# Patient Record
Sex: Female | Born: 2000 | Hispanic: Yes | Marital: Single
Health system: Midwestern US, Community
[De-identification: ages and names within clinical notes are randomized; demographics above are authoritative.]

## PROBLEM LIST (undated history)

## (undated) DIAGNOSIS — Z3A27 27 weeks gestation of pregnancy: Secondary | ICD-10-CM

## (undated) DIAGNOSIS — Z331 Pregnant state, incidental: Secondary | ICD-10-CM

## (undated) DIAGNOSIS — Z3492 Encounter for supervision of normal pregnancy, unspecified, second trimester: Secondary | ICD-10-CM

---
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
## 2021-03-12 ENCOUNTER — Inpatient Hospital Stay
Admit: 2021-03-12 | Discharge: 2021-03-12 | Disposition: A | Discharge status: Home / Self Care | Payer: TRICARE (CHAMPUS) | Attending: Emergency Medicine

## 2021-03-12 DIAGNOSIS — R21 Rash and other nonspecific skin eruption: Secondary | ICD-10-CM

## 2021-03-12 MED ORDER — TRIAMCINOLONE ACETONIDE 0.025 % OINTMENT
0.025 % | Freq: Two times a day (BID) | CUTANEOUS | 0 refills | Status: DC
Start: 2021-03-12 — End: 2021-03-23

## 2021-03-12 NOTE — ED Provider Notes (Signed)
EMERGENCY DEPARTMENT HISTORY AND PHYSICAL EXAM    7:59 AM    Date: 03/12/2021  Patient Name: Jaclyn Jimenez    History of Presenting Illness     Chief Complaint   Patient presents with   ??? Rash       History Provided By: Patient  Location/Duration/Severity/Modifying factors   HPI   Jaclyn Jimenez is a 20 y.o. female with no past medical problems presenting for evaluation of rash on her left leg.  She says that for the last 24 or so hours she has had an eruption rash on her left leg.  It is mildly painful, describes it as a dull ache.  It is red and blistering on the surface.  He denies any exposures to any viruses, strange metals, new medication, change in diet.  No hives rash or anaphylaxis like symptoms.  She applied some anti-itch cream on it but this has not helped with any of the pain or the itching.  Denies history of HSV, shingles, and is vaccinated against varicella.  No similar lesions elsewhere on her body including genitalia or lips.  No alleviating or aggravating factors.  Overall symptoms are mild to moderate    PCP: No primary care provider on file.    Current Outpatient Medications   Medication Sig Dispense Refill   ??? triamcinolone acetonide (KENALOG) 0.025 % ointment Apply  to affected area two (2) times a day. use thin layer 30 g 0       Past History     Past Medical History:  No past medical history on file.    Past Surgical History:  No past surgical history on file.    Family History:  No family history on file.    Social History:  Social History     Tobacco Use   ??? Smoking status: Not on file   ??? Smokeless tobacco: Not on file   Substance Use Topics   ??? Alcohol use: Not on file   ??? Drug use: Not on file       Allergies:  No Known Allergies    I reviewed and confirmed the above information with patient and updated as necessary.    Review of Systems     Review of Systems   Constitutional: Negative for diaphoresis and fever.   HENT: Negative for ear pain and sore throat.    Eyes:         No acute change in vision   Respiratory: Negative for cough and shortness of breath.    Cardiovascular: Negative for chest pain and leg swelling.   Gastrointestinal: Negative for abdominal pain and vomiting.   Genitourinary: Negative for dysuria.   Musculoskeletal: Negative for neck pain.   Skin: Positive for rash. Negative for wound.   Neurological: Negative for weakness and headaches.       Physical Exam     Visit Vitals  BP 107/76   Pulse 90   Temp 98.8 ??F (37.1 ??C)   Resp 18   Wt 50.8 kg (112 lb)   SpO2 100%       Physical Exam  Vitals and nursing note reviewed.   Constitutional:       Comments: Adult female resting comfortably, nondistressed   HENT:      Mouth/Throat:      Mouth: Mucous membranes are moist.   Eyes:      Pupils: Pupils are equal, round, and reactive to light.   Cardiovascular:  Rate and Rhythm: Normal rate and regular rhythm.      Pulses: Normal pulses.   Pulmonary:      Effort: Pulmonary effort is normal.      Breath sounds: Normal breath sounds.   Abdominal:      Palpations: Abdomen is soft.      Tenderness: There is no abdominal tenderness.   Musculoskeletal:         General: No signs of injury. Normal range of motion.      Cervical back: Normal range of motion.   Skin:     General: Skin is warm.      Findings: Rash (3 cm x 3 cm area of erythema with small appearing pustules on the surface.  Minimally tender to palpation) present.   Neurological:      General: No focal deficit present.      Mental Status: She is alert and oriented to person, place, and time. Mental status is at baseline.         Diagnostic Study Results     Labs -  No results found for this or any previous visit (from the past 24 hour(s)).      Radiologic Studies -   No orders to display           Medical Decision Making   I am the first provider for this patient.    I reviewed the vital signs, available nursing notes, past medical history, past surgical history, family history and social history.    Vital  Signs-Reviewed the patient's vital signs.    Records Reviewed: Nursing Notes and Old Medical Records (Time of Review: 7:59 AM)    Provider Notes (Medical Decision Making):   MDM  20 year old female here with rash on her leg, consider ringworm, bacterial, abscess, viral such as HSV or varicella/shingles.    ED Course: Progress Notes, Reevaluation, and Consults:  Patient afebrile hemodynamically normal  Bedside ultrasound without underlying loculations or fluid collection, no evidence of an abscess    Discussed care plan with the patient, will test for viral etiologies with a viral culture, will treat with triamcinolone cream and ibuprofen for now.  Signs and symptoms prompting return to the ED were discussed.  She was discharged home in stable condition           Procedures    Diagnosis     Clinical Impression:   1. Rash/skin eruption      Disposition: Home    Follow-up Information     Follow up With Specialties Details Why Contact Info    [facility] EMERGENCY DEPT Emergency Medicine  As needed, If symptoms worsen [address]  [place] [address]  [phone]           Patient's Medications   Start Taking    TRIAMCINOLONE ACETONIDE (KENALOG) 0.025 % OINTMENT    Apply  to affected area two (2) times a day. use thin layer   Continue Taking    No medications on file   These Medications have changed    No medications on file   Stop Taking    No medications on file       Charline Bills, MD   Emergency Medicine   March 12, 2021, 7:59 AM     This note is dictated utilizing Conservation officer, historic buildings. Unfortunately this leads to occasional typographical errors using the voice recognition. I apologize in advance if the situation occurs. If questions occur please do not hesitate to contact  me directly.    Abner Greenspan, MD

## 2021-03-12 NOTE — ED Notes (Signed)
Patient arrived to ED with rash on left upper thigh x 3 days

## 2021-03-19 LAB — CULTURE, VIRAL

## 2021-03-23 ENCOUNTER — Inpatient Hospital Stay
Admit: 2021-03-23 | Discharge: 2021-03-23 | Disposition: A | Discharge status: Home / Self Care | Payer: TRICARE (CHAMPUS) | Attending: Emergency Medicine

## 2021-03-23 DIAGNOSIS — N3000 Acute cystitis without hematuria: Secondary | ICD-10-CM

## 2021-03-23 LAB — WET PREP
Wet Prep: NONE SEEN
Wet Prep: NONE SEEN
Wet prep: NONE SEEN
Wet prep: NONE SEEN

## 2021-03-23 LAB — CBC WITH AUTOMATED DIFF
ABS. BASOPHILS: 0 10*3/uL (ref 0.0–0.1)
ABS. EOSINOPHILS: 0.1 10*3/uL (ref 0.0–0.4)
ABS. IMM. GRANS.: 0 10*3/uL (ref 0.00–0.04)
ABS. LYMPHOCYTES: 2.2 10*3/uL (ref 0.9–3.6)
ABS. MONOCYTES: 0.5 10*3/uL (ref 0.05–1.2)
ABS. NEUTROPHILS: 4.6 10*3/uL (ref 1.8–8.0)
ABSOLUTE NRBC: 0 10*3/uL (ref 0.00–0.01)
BASOPHILS: 0 % (ref 0–2)
EOSINOPHILS: 1 % (ref 0–5)
HCT: 40.8 % (ref 35.0–45.0)
HGB: 13.5 g/dL (ref 12.0–16.0)
IMMATURE GRANULOCYTES: 0 % (ref 0.0–0.5)
LYMPHOCYTES: 29 % (ref 21–52)
MCH: 30.3 PG (ref 24.0–34.0)
MCHC: 33.1 g/dL (ref 31.0–37.0)
MCV: 91.5 FL (ref 78.0–100.0)
MONOCYTES: 7 % (ref 3–10)
MPV: 11.2 FL (ref 9.2–11.8)
NEUTROPHILS: 62 % (ref 40–73)
NRBC: 0 PER 100 WBC
PLATELET: 352 10*3/uL (ref 135–420)
RBC: 4.46 M/uL (ref 4.20–5.30)
RDW: 13.2 % (ref 11.6–14.5)
WBC: 7.4 10*3/uL (ref 4.6–13.2)

## 2021-03-23 LAB — METABOLIC PANEL, COMPREHENSIVE
A-G Ratio: 1.1 (ref 0.8–1.7)
ALT (SGPT): 18 U/L (ref 13–56)
AST (SGOT): 16 U/L (ref 10–38)
Albumin: 4.2 g/dL (ref 3.4–5.0)
Alk. phosphatase: 106 U/L (ref 45–117)
Anion gap: 1 mmol/L — ABNORMAL LOW (ref 3.0–18)
BUN/Creatinine ratio: 17 (ref 12–20)
BUN: 11 MG/DL (ref 7.0–18)
Bilirubin, total: 0.6 MG/DL (ref 0.2–1.0)
CO2: 31 mmol/L (ref 21–32)
Calcium: 9.4 MG/DL (ref 8.5–10.1)
Chloride: 107 mmol/L (ref 100–111)
Creatinine: 0.65 MG/DL (ref 0.6–1.3)
GFR est AA: >60 mL/min/{1.73_m2} (ref >60)
GFR est non-AA: >60 mL/min/{1.73_m2} (ref >60)
Globulin: 3.8 g/dL (ref 2.0–4.0)
Glucose: 88 mg/dL (ref 74–99)
Potassium: 3.9 mmol/L (ref 3.5–5.5)
Protein, total: 8 g/dL (ref 6.4–8.2)
Sodium: 139 mmol/L (ref 136–145)

## 2021-03-23 LAB — URINALYSIS W/ RFLX MICROSCOPIC
Bilirubin, Urine: NEGATIVE
Bilirubin: NEGATIVE
Glucose, Ur: NEGATIVE mg/dL
Glucose: NEGATIVE mg/dL
Ketone: NEGATIVE mg/dL
Ketones, Urine: NEGATIVE mg/dL
Nitrite, Urine: NEGATIVE
Nitrites: NEGATIVE
Protein, UA: NEGATIVE mg/dL
Protein: NEGATIVE mg/dL
Specific Gravity, UA: 1.01 (ref 1.005–1.030)
Specific gravity: 1.01 (ref 1.005–1.030)
Urobilinogen, UA, POCT: 0.2 EU/dL (ref 0.2–1.0)
Urobilinogen: 0.2 EU/dL (ref 0.2–1.0)
pH (UA): 7.5 (ref 5.0–8.0)
pH, UA: 7.5 (ref 5.0–8.0)

## 2021-03-23 LAB — URINE MICROSCOPIC ONLY

## 2021-03-23 LAB — HCG URINE, QL
HCG urine, QL: NEGATIVE
Pregnancy Test(Urn): NEGATIVE

## 2021-03-23 LAB — LIPASE
Lipase: 123 U/L (ref 73–393)
Lipase: 123 U/L (ref 73–393)

## 2021-03-23 LAB — CBC WITH AUTO DIFFERENTIAL
Basophils %: 0 % (ref 0–2)
Basophils Absolute: 0 10*3/uL (ref 0.0–0.1)
Eosinophils %: 1 % (ref 0–5)
Eosinophils Absolute: 0.1 10*3/uL (ref 0.0–0.4)
Granulocyte Absolute Count: 0 10*3/uL (ref 0.00–0.04)
Hematocrit: 40.8 % (ref 35.0–45.0)
Hemoglobin: 13.5 g/dL (ref 12.0–16.0)
Immature Granulocytes: 0 % (ref 0.0–0.5)
Lymphocytes %: 29 % (ref 21–52)
Lymphocytes Absolute: 2.2 10*3/uL (ref 0.9–3.6)
MCH: 30.3 PG (ref 24.0–34.0)
MCHC: 33.1 g/dL (ref 31.0–37.0)
MCV: 91.5 FL (ref 78.0–100.0)
MPV: 11.2 FL (ref 9.2–11.8)
Monocytes %: 7 % (ref 3–10)
Monocytes Absolute: 0.5 10*3/uL (ref 0.05–1.2)
NRBC Absolute: 0 10*3/uL (ref 0.00–0.01)
Neutrophils %: 62 % (ref 40–73)
Neutrophils Absolute: 4.6 10*3/uL (ref 1.8–8.0)
Nucleated RBCs: 0 PER 100 WBC
Platelets: 352 10*3/uL (ref 135–420)
RBC: 4.46 M/uL (ref 4.20–5.30)
RDW: 13.2 % (ref 11.6–14.5)
WBC: 7.4 10*3/uL (ref 4.6–13.2)

## 2021-03-23 LAB — COMPREHENSIVE METABOLIC PANEL
ALT: 18 U/L (ref 13–56)
AST: 16 U/L (ref 10–38)
Albumin/Globulin Ratio: 1.1 (ref 0.8–1.7)
Albumin: 4.2 g/dL (ref 3.4–5.0)
Alkaline Phosphatase: 106 U/L (ref 45–117)
Anion Gap: 1 mmol/L — ABNORMAL LOW (ref 3.0–18)
BUN: 11 MG/DL (ref 7.0–18)
Bun/Cre Ratio: 17 (ref 12–20)
CO2: 31 mmol/L (ref 21–32)
Calcium: 9.4 MG/DL (ref 8.5–10.1)
Chloride: 107 mmol/L (ref 100–111)
Creatinine: 0.65 MG/DL (ref 0.6–1.3)
EGFR IF NonAfrican American: >60 mL/min/{1.73_m2} (ref >60)
GFR African American: >60 mL/min/{1.73_m2} (ref >60)
Globulin: 3.8 g/dL (ref 2.0–4.0)
Glucose: 88 mg/dL (ref 74–99)
Potassium: 3.9 mmol/L (ref 3.5–5.5)
Sodium: 139 mmol/L (ref 136–145)
Total Bilirubin: 0.6 MG/DL (ref 0.2–1.0)
Total Protein: 8 g/dL (ref 6.4–8.2)

## 2021-03-23 MED ORDER — CEPHALEXIN 500 MG CAP
500 mg | ORAL_CAPSULE | Freq: Two times a day (BID) | ORAL | 0 refills | Status: AC
Start: 2021-03-23 — End: 2021-03-30

## 2021-03-23 NOTE — ED Provider Notes (Signed)
EMERGENCY DEPARTMENT HISTORY AND PHYSICAL EXAM    Date: 03/23/2021  Patient Name: Jaclyn Jimenez    History of Presenting Illness     Chief Complaint   Patient presents with   ??? Vaginal Bleeding       History Provided By: Patient     History Jaclyn Jimenez):   1:37 PM  Jaclyn Jimenez is a 20 y.o. female with no contributory PMHX who presents to the emergency department C/O vaginal bleeding onset 2 weeks ago. Associated sxs include dysuria. Pt denies any other sxs or complaints.  Patient's been having 2 weeks of dysuria and vaginal bleeding.  She is having a lot of pelvic discomfort.  She is requesting STD check as well.    Chief Complaint: Vaginal bleeding  Onset: 2 weeks ago  Timing:  Acute  Context: Symptoms started spontaneously, symptoms have progressively worsened since onset  Location: Vagina  Quality: Sharp  Severity: Moderate  Modifying Factors: Nothing makes it better, urinating makes it worse.  Associated Symptoms: Dysuria, pelvic discomfort    PCP: Other, Phys, MD     Past History         Past Medical History:  History reviewed. No pertinent past medical history.    Past Surgical History:  History reviewed. No pertinent surgical history.    Family History:  History reviewed. No pertinent family history.  Reviewed and non-contributory    Social History:  Social History     Tobacco Use   ??? Smoking status: Never Smoker   ??? Smokeless tobacco: Never Used   Substance Use Topics   ??? Alcohol use: Not Currently   ??? Drug use: Not Currently       Medications:      Allergies:  No Known Allergies    Review of Systems      Review of Systems   Constitutional: Negative for chills and fever.   HENT: Negative for congestion, rhinorrhea and sore throat.    Eyes: Negative for pain and visual disturbance.   Respiratory: Negative for cough, shortness of breath and wheezing.    Cardiovascular: Negative for chest pain and palpitations.   Gastrointestinal: Positive for abdominal pain. Negative for diarrhea and  vomiting.   Genitourinary: Positive for dysuria, pelvic pain, vaginal bleeding and vaginal discharge. Negative for flank pain, frequency and urgency.   Musculoskeletal: Negative for arthralgias and myalgias.   Skin: Negative for rash and wound.   Neurological: Negative for speech difficulty, weakness, light-headedness and headaches.   Psychiatric/Behavioral: Negative for agitation and confusion.   All other systems reviewed and are negative.      Physical Exam     Vitals:    03/23/21 1209   BP: 115/69   Pulse: 78   Resp: 17   Temp: 98 ??F (36.7 ??C)   SpO2: 100%   Weight: 50.8 kg (112 lb)   Height: 4' 11"  (1.499 m)       Physical Exam  Vitals and nursing note reviewed.   Constitutional:       General: She is not in acute distress.     Appearance: Normal appearance. She is normal weight. She is not ill-appearing.   HENT:      Head: Normocephalic and atraumatic.      Nose: Nose normal. No rhinorrhea.      Mouth/Throat:      Mouth: Mucous membranes are moist.      Pharynx: No oropharyngeal exudate or posterior oropharyngeal erythema.   Eyes:  Extraocular Movements: Extraocular movements intact.      Conjunctiva/sclera: Conjunctivae normal.      Pupils: Pupils are equal, round, and reactive to light.   Cardiovascular:      Rate and Rhythm: Normal rate and regular rhythm.      Heart sounds: No murmur heard.  No friction rub. No gallop.    Pulmonary:      Effort: Pulmonary effort is normal. No respiratory distress.      Breath sounds: Normal breath sounds. No wheezing, rhonchi or rales.   Abdominal:      General: Bowel sounds are normal.      Palpations: Abdomen is soft.      Tenderness: There is abdominal tenderness in the suprapubic area. There is no guarding or rebound. Negative signs include Murphy's sign, Rovsing's sign and McBurney's sign.   Musculoskeletal:         General: No swelling, tenderness or deformity. Normal range of motion.      Cervical back: Normal range of motion and neck supple. No rigidity.    Lymphadenopathy:      Cervical: No cervical adenopathy.   Skin:     General: Skin is warm and dry.      Findings: No rash.   Neurological:      General: No focal deficit present.      Mental Status: She is alert and oriented to person, place, and time.   Psychiatric:         Mood and Affect: Mood normal.         Behavior: Behavior normal.         Diagnostic Study Results     Labs -  Recent Results (from the past 12 hour(s))   CBC WITH AUTOMATED DIFF    Collection Time: 03/23/21  1:20 PM   Result Value Ref Range    WBC 7.4 4.6 - 13.2 K/uL    RBC 4.46 4.20 - 5.30 M/uL    HGB 13.5 12.0 - 16.0 g/dL    HCT 40.8 35.0 - 45.0 %    MCV 91.5 78.0 - 100.0 FL    MCH 30.3 24.0 - 34.0 PG    MCHC 33.1 31.0 - 37.0 g/dL    RDW 13.2 11.6 - 14.5 %    PLATELET 352 135 - 420 K/uL    MPV 11.2 9.2 - 11.8 FL    NRBC 0.0 0 PER 100 WBC    ABSOLUTE NRBC 0.00 0.00 - 0.01 K/uL    NEUTROPHILS 62 40 - 73 %    LYMPHOCYTES 29 21 - 52 %    MONOCYTES 7 3 - 10 %    EOSINOPHILS 1 0 - 5 %    BASOPHILS 0 0 - 2 %    IMMATURE GRANULOCYTES 0 0.0 - 0.5 %    ABS. NEUTROPHILS 4.6 1.8 - 8.0 K/UL    ABS. LYMPHOCYTES 2.2 0.9 - 3.6 K/UL    ABS. MONOCYTES 0.5 0.05 - 1.2 K/UL    ABS. EOSINOPHILS 0.1 0.0 - 0.4 K/UL    ABS. BASOPHILS 0.0 0.0 - 0.1 K/UL    ABS. IMM. GRANS. 0.0 0.00 - 0.04 K/UL    DF AUTOMATED     METABOLIC PANEL, COMPREHENSIVE    Collection Time: 03/23/21  1:20 PM   Result Value Ref Range    Sodium 139 136 - 145 mmol/L    Potassium 3.9 3.5 - 5.5 mmol/L    Chloride 107 100 - 111 mmol/L    CO2  31 21 - 32 mmol/L    Anion gap 1 (L) 3.0 - 18 mmol/L    Glucose 88 74 - 99 mg/dL    BUN 11 7.0 - 18 MG/DL    Creatinine 0.65 0.6 - 1.3 MG/DL    BUN/Creatinine ratio 17 12 - 20      GFR est AA >60 >60 ml/min/1.15m    GFR est non-AA >60 >60 ml/min/1.760m   Calcium 9.4 8.5 - 10.1 MG/DL    Bilirubin, total 0.6 0.2 - 1.0 MG/DL    ALT (SGPT) 18 13 - 56 U/L    AST (SGOT) 16 10 - 38 U/L    Alk. phosphatase 106 45 - 117 U/L    Protein, total 8.0 6.4 - 8.2 g/dL     Albumin 4.2 3.4 - 5.0 g/dL    Globulin 3.8 2.0 - 4.0 g/dL    A-G Ratio 1.1 0.8 - 1.7     LIPASE    Collection Time: 03/23/21  1:20 PM   Result Value Ref Range    Lipase 123 73 - 393 U/L   URINALYSIS W/ RFLX MICROSCOPIC    Collection Time: 03/23/21  1:39 PM   Result Value Ref Range    Color YELLOW      Appearance CLOUDY      Specific gravity 1.010 1.005 - 1.030      pH (UA) 7.5 5.0 - 8.0      Protein Negative NEG mg/dL    Glucose Negative NEG mg/dL    Ketone Negative NEG mg/dL    Bilirubin Negative NEG      Blood LARGE (A) NEG      Urobilinogen 0.2 0.2 - 1.0 EU/dL    Nitrites Negative NEG      Leukocyte Esterase LARGE (A) NEG     URINE MICROSCOPIC ONLY    Collection Time: 03/23/21  1:39 PM   Result Value Ref Range    WBC TOO NUMEROUS TO COUNT 0 - 5 /hpf    RBC TOO NUMEROUS TO COUNT 0 - 5 /hpf    Epithelial cells FEW 0 - 5 /lpf    Bacteria FEW (A) NEG /hpf   HCG URINE, QL    Collection Time: 03/23/21  1:40 PM   Result Value Ref Range    HCG urine, QL Negative NEG     WET PREP    Collection Time: 03/23/21  2:05 PM    Specimen: Vagina   Result Value Ref Range    Special Requests: NO SPECIAL REQUESTS      Wet prep FEW  CLUE CELLS PRESENT        Wet prep NO YEAST SEEN      Wet prep NO TRICHOMONAS SEEN         Radiologic Studies -   No orders to display     CT Results  (Last 48 hours)    None        CXR Results  (Last 48 hours)    None          Medications given in the ED-  Medications - No data to display    Procedures     Pelvic Exam    Date/Time: 03/23/2021 2:11 PM  Procedure duration:  10 minutes.  Exam assisted by:  AmEstill BambergRN.  Type of exam performed: speculum.    External genitalia appearance: normal.    Vaginal exam:  bleeding.    Bleeding: mild  Cervical exam:  normal.  Specimen(s) collected:  chlamydia, GC and vaginal culture.  Patient tolerance: patient tolerated the procedure well with no immediate complications          ED Course     I am the first provider for this patient.  I reviewed the vital signs,  available nursing notes, past medical history, past surgical history, family history and social history.    Records Reviewed: Nursing Notes    Cardiac Monitor:  Rate: 78 bpm  Rhythm: sinus rhythm    Pulse Oximetry Analysis - 100% on RA    1:37 PM Initial assessment performed. The patients presenting problems have been discussed, and they are in agreement with the care plan formulated and outlined with them.  I have encouraged them to ask questions as they arise throughout their visit.           Medical Decision Making     Provider Notes (Medical Decision Making):   DDX: UTI, BV, pyelonephritis, STI    Discussion:  20 y.o. female with dysuria, vaginal bleeding and vaginal discharge.  Patient is concerned for STI.  Patient does have signs of UTI today.  No BV or yeast infection.  Patient has pending chlamydia and gonorrhea swabs which will be called to her if she is positive for other.  Patient may follow-up with her primary care doctor or 1 of ours.  Patient understands and agrees with this plan.      Diagnosis and Disposition     DISCHARGE NOTE:  3:44 PM   [place] Vivar Martinez's  results have been reviewed with her.  She has been counseled regarding her diagnosis, treatment, and plan.  She verbally conveys understanding and agreement of the signs, symptoms, diagnosis, treatment and prognosis and additionally agrees to follow up as discussed.  She also agrees with the care-plan and conveys that all of her questions have been answered.  I have also provided discharge instructions for her that include: educational information regarding their diagnosis and treatment, and list of reasons why they would want to return to the ED prior to their follow-up appointment, should her condition change. She has been provided with education for proper emergency department utilization.     CLINICAL IMPRESSION:    1. Acute cystitis without hematuria    2. Vaginal bleeding        PLAN:  1. D/C Home  2.   Current Discharge Medication  List      START taking these medications    Details   cephALEXin (Keflex) 500 mg capsule Take 1 Capsule by mouth two (2) times a day for 7 days.  Qty: 14 Capsule, Refills: 0  Start date: 03/23/2021, End date: 03/30/2021           3.   Follow-up Information     Follow up With Specialties Details [facility]  Schedule an appointment as soon as possible for a visit  As soon as possible, For follow up from Emergency Department visit. [place]  [phone]    [facility]  Schedule an appointment as soon as possible for a visit  As soon as possible, For follow up from Emergency Department visit. [place]  [phone]    Chase Picket, MD Obstetrics & Gynecology Schedule an appointment as soon as possible for a visit  As soon as possible, For follow up from Emergency  Department visit. [place] [address]  [phone]      [facility] EMERGENCY DEPT Emergency Medicine  As needed; If symptoms worsen [address]  [place] [address]  [place]     Please note that this dictation was completed with Dragon, the computer voice recognition software.  Quite often unanticipated grammatical, syntax, homophones, and other interpretive errors are inadvertently transcribed by the computer software.  Please disregard these errors.  Please excuse any errors that have escaped final proofreading.    Jeanella Craze, MD

## 2021-03-23 NOTE — ED Notes (Signed)
 Pt states  I always feel like I have to pee and I have been bleeding for about 2 weeks and it hurts when I pee. I am just having a lot of discomfort down there. I would like to be checked for an STD

## 2021-03-24 LAB — CHLAMYDIA/NEISSERIA AMPLIFICATION
Chlamydia amplification: NEGATIVE
N. gonorrhoeae amplification: NEGATIVE

## 2021-03-24 LAB — C.TRACHOMATIS N.GONORRHOEAE DNA
Chlamydia trachomatis, NAA: NEGATIVE
Neisseria Gonorrhoeae, NAA: NEGATIVE

## 2021-06-18 ENCOUNTER — Inpatient Hospital Stay
Admit: 2021-06-18 | Discharge: 2021-06-18 | Disposition: A | Discharge status: Home / Self Care | Payer: TRICARE (CHAMPUS) | Attending: Emergency Medicine

## 2021-06-18 DIAGNOSIS — N6452 Nipple discharge: Secondary | ICD-10-CM

## 2021-06-18 NOTE — ED Notes (Signed)
Patient reports breast milk leaking 2 weeks post abortion of 9 week pregnancy

## 2021-06-18 NOTE — ED Provider Notes (Signed)
ED Provider Notes by Veronia Beets, PA at 06/18/21 1012                Author: Veronia Beets, PA  Service: Emergency Medicine  Author Type: Physician Assistant       Filed: 06/19/21 1222  Date of Service: 06/18/21 1012  Status: Attested           Editor: Veronia Beets, PA (Physician Assistant)  Cosigner: Buck Mam, MD at 06/19/21 1754          Attestation signed by Buck Mam, MD at 06/19/21 1754          I was personally available for consultation in the emergency department.                                   EMERGENCY DEPARTMENT HISTORY AND PHYSICAL EXAM      Date: 06/18/2021   Patient Name: Jaclyn Jimenez        History of Presenting Illness          Chief Complaint       Patient presents with        ?  Breast Discharge              History Provided By: Patient      10:12 AM   Jaclyn Jimenez is a 20 y.o. female who presents to the emergency department C/O whitish thin discharge from both nipples for the last couple days.  Patient states she had an uncomplicated medical abortion (at [redacted]wks pregnant) almost 2 weeks ago.   She denies any related complaints such as persistent or heavier than expected vaginal bleeding or pain.  She called the abortion clinic and they told her to go see [place].  Pt  denies fever, breast pain, bloody discharge, breast masses, and any other sxs or complaints.       PCP: Other, Phys, MD              Past History        Past Medical History:   History reviewed. No pertinent past medical history.      Past Surgical History:   History reviewed. No pertinent surgical history.      Family History:   History reviewed. No pertinent family history.      Social History:     Social History          Tobacco Use         ?  Smoking status:  Never     ?  Smokeless tobacco:  Never       Substance Use Topics         ?  Alcohol use:  Not Currently         ?  Drug use:  Not Currently            Allergies:   No Known Allergies           Review of Systems     Review of Systems    Constitutional:  Negative for fever.    Skin: Negative.     All other systems reviewed and are negative.           Physical Exam          Vitals:  06/18/21 0920        BP:  103/66     Pulse:  73     Resp:  18     Temp:  98.8 ??F (37.1 ??C)     SpO2:  100%        Weight:  50.8 kg (112 lb)        Physical Exam   Vital signs and nursing notes reviewed.      CONSTITUTIONAL: Alert. Well-appearing; well-nourished; in no apparent distress.   HEAD: Normocephalic; atraumatic.Marland Kitchen   NECK: Supple; FROM without difficulty, non-tender; no cervical lymphadenopathy.       BREAST: Breast exam chaperoned by RN.  No abnormal skin lesions, redness, swelling or masses palpated.  No nipple discharge noted during exam.          SKIN: Normal for age and race; warm; dry; good turgor; no apparent lesions or exudate.   NEURO: A & O x3.    PSYCH:  Mood and affect appropriate.                  Diagnostic Study Results        Labs -    No results found for this or any previous visit (from the past 12 hour(s)).      Radiologic Studies -      No orders to display          CT Results  (Last 48 hours)             None                    CXR Results  (Last 48 hours)             None                     Medications given in the ED-   Medications - No data to display           Medical Decision Making     I am the first provider for this patient.      I reviewed the vital signs, available nursing notes, past medical history, past surgical history, family history and social history.      Vital Signs-Reviewed the patient's vital signs.      Records Reviewed: Nursing Notes         Procedures:   Procedures      ED Course:   10:12 AM    Initial assessment performed. The patients presenting problems have been discussed, and they are in agreement with the care plan formulated and outlined with them.  I have encouraged them to ask questions as they arise throughout  their visit.                Provider Notes (Medical Decision Making): Jaclyn Jimenez is a 20 y.o. female presents  with thin white discharge c/w lactation from both nipples several days after having a medical abortion.  There are no signs of infection, mastitis, masses or other complications, no discharge seen or expressed on exam.  Should resolve on its own the patient  to follow-up with PCP to ensure resolution and no need for further breast work-up for other causes of nipple discharge.        Diagnosis and Disposition           DISCHARGE NOTE:      Jaclyn Jimenez's  results have been reviewed with her.  She  has been counseled regarding her diagnosis, treatment, and plan.  She verbally conveys understanding and agreement of the signs, symptoms, diagnosis, treatment and prognosis and additionally  agrees to follow up as discussed.  She also agrees with the care-plan and conveys that all of her questions have been answered.  I have also provided discharge instructions for her that include: educational information regarding their diagnosis and treatment,  and list of reasons why they would want to return to the ED prior to their follow-up appointment, should her condition change. She has been provided with education for proper emergency department utilization.       CLINICAL IMPRESSION:         1.  Discharge from both nipples            PLAN:   1. D/C Home   2. There are no discharge medications for this patient.      3.      Follow-up Information                  Follow up With  Specialties  Details  Why  Contact Info              [facility]    Schedule an appointment as soon as possible for a visit     [address]   [place] [address]   [phone]              [facility] EMERGENCY DEPT  Emergency Medicine    As needed, If symptoms worsen  [address]   [place] [address]   [phone]                  _______________________________         Please note that this  dictation was completed with Dragon, the computer voice recognition software.  Quite often unanticipated grammatical, syntax, homophones,  and other interpretive errors are inadvertently transcribed by the computer software.  Please disregard these errors.  Please excuse any errors that have escaped final proofreading.

## 2021-08-11 ENCOUNTER — Inpatient Hospital Stay
Admit: 2021-08-11 | Discharge: 2021-08-11 | Disposition: A | Discharge status: Home / Self Care | Attending: Emergency Medicine

## 2021-08-11 DIAGNOSIS — N949 Unspecified condition associated with female genital organs and menstrual cycle: Secondary | ICD-10-CM

## 2021-08-11 LAB — URINALYSIS W/ RFLX MICROSCOPIC
Bilirubin, Urine: NEGATIVE
Bilirubin: NEGATIVE
Glucose, Ur: NEGATIVE mg/dL
Glucose: NEGATIVE mg/dL
Ketone: NEGATIVE mg/dL
Ketones, Urine: NEGATIVE mg/dL
Nitrite, Urine: NEGATIVE
Nitrites: NEGATIVE
Protein, UA: 30 mg/dL — AB
Protein: 30 mg/dL — AB
Specific Gravity, UA: >1.03 NA — ABNORMAL HIGH (ref 1.003–1.030)
Specific gravity: >1.03 — ABNORMAL HIGH (ref 1.003–1.030)
Urobilinogen, UA, POCT: 1 EU/dL (ref 0.2–1.0)
Urobilinogen: 1 EU/dL (ref 0.2–1.0)
pH (UA): 6 (ref 5.0–8.0)
pH, UA: 6 (ref 5.0–8.0)

## 2021-08-11 LAB — CHLAMYDIA/NEISSERIA AMPLIFICATION
Chlamydia amplification: NEGATIVE
N. gonorrhoeae amplification: NEGATIVE

## 2021-08-11 LAB — URINE MICROSCOPIC ONLY
RBC, UA: 4 /hpf (ref 0–5)
RBC: 4 /hpf (ref 0–5)
WBC, UA: 11 /hpf (ref 0–5)
WBC: 11 /hpf (ref 0–5)

## 2021-08-11 LAB — WET PREP

## 2021-08-11 LAB — HCG URINE, QL
HCG urine, QL: NEGATIVE
Pregnancy Test(Urn): NEGATIVE

## 2021-08-11 LAB — C.TRACHOMATIS N.GONORRHOEAE DNA
Chlamydia trachomatis, NAA: NEGATIVE
Neisseria Gonorrhoeae, NAA: NEGATIVE

## 2021-08-11 MED ORDER — TRIMETHOPRIM-SULFAMETHOXAZOLE 160 MG-800 MG TAB
160-800 mg | ORAL_TABLET | Freq: Two times a day (BID) | ORAL | 0 refills | Status: AC
Start: 2021-08-11 — End: 2021-08-16

## 2021-08-11 MED ORDER — FLUCONAZOLE 100 MG TAB
100 mg | ORAL | Status: AC
Start: 2021-08-11 — End: 2021-08-11
  Administered 2021-08-11: 11:00:00 via ORAL

## 2021-08-11 MED ORDER — TRIMETHOPRIM-SULFAMETHOXAZOLE 160 MG-800 MG TAB
160-800 mg | ORAL_TABLET | Freq: Two times a day (BID) | ORAL | 0 refills | Status: DC
Start: 2021-08-11 — End: 2021-08-11

## 2021-08-11 MED ORDER — CLOBETASOL 0.05 % TOPICAL CREAM
0.05 % | Freq: Two times a day (BID) | CUTANEOUS | 0 refills | Status: AC
Start: 2021-08-11 — End: 2021-09-02

## 2021-08-11 MED ORDER — CLOBETASOL 0.05 % TOPICAL CREAM
0.05 % | Freq: Two times a day (BID) | CUTANEOUS | 0 refills | Status: DC
Start: 2021-08-11 — End: 2021-08-11

## 2021-08-11 MED FILL — FLUCONAZOLE 100 MG TAB: 100 mg | ORAL | Qty: 2

## 2021-08-11 NOTE — ED Notes (Signed)
C/O vaginal itching/burning/discharge x 4 days; Endorses having new sexual partner recently but no other new products endorsed.    No fever/CP/SOB/N/V/D.

## 2021-08-11 NOTE — ED Notes (Signed)
Report given to JZ, RN. All questions answered.

## 2021-08-11 NOTE — ED Provider Notes (Signed)
20 year old female without medical history presents to the emergency department with vaginal discomfort.  She says she has "small cuts"  She does not know how that happened.  Also has yellow vaginal discharge not sure if she is pregnant concerned about possible STI.  No history of similar episode.  Now she has vaginal burning with urination.  No blood in urine.  However she feels she is starting her period.  Patient has no fevers or chills no chest pain or shortness of breath no abdominal pain.  No other issues expressed.  Soft ibuprofen last night with some relief.       History reviewed. No pertinent past medical history.    History reviewed. No pertinent surgical history.      History reviewed. No pertinent family history.    Social History     Socioeconomic History   ??? Marital status: SINGLE     Spouse name: Not on file   ??? Number of children: Not on file   ??? Years of education: Not on file   ??? Highest education level: Not on file   Occupational History   ??? Not on file   Tobacco Use   ??? Smoking status: Never   ??? Smokeless tobacco: Never   Substance and Sexual Activity   ??? Alcohol use: Not Currently   ??? Drug use: Not Currently   ??? Sexual activity: Not on file   Other Topics Concern   ??? Not on file   Social History Narrative   ??? Not on file     Social Determinants of Health     Financial Resource Strain: Not on file   Food Insecurity: Not on file   Transportation Needs: Not on file   Physical Activity: Not on file   Stress: Not on file   Social Connections: Not on file   Intimate Partner Violence: Not on file   Housing Stability: Not on file         ALLERGIES: Patient has no known allergies.    Review of Systems   Constitutional: Negative.    HENT: Negative.     Respiratory: Negative.     Genitourinary:  Positive for dysuria and vaginal pain. Negative for decreased urine volume, enuresis, flank pain, frequency, genital sores, hematuria, menstrual problem, pelvic pain, urgency, vaginal bleeding and vaginal  discharge.   Neurological: Negative.    Hematological: Negative.    Psychiatric/Behavioral: Negative.     All other systems reviewed and are negative.    Vitals:    08/11/21 0625   Pulse: 78   Resp: 20   SpO2: 99%   Weight: 47.6 kg (105 lb)   Height: 4\' 11"  (1.499 m)            Physical Exam  Vitals and nursing note reviewed.   Constitutional:       Appearance: Normal appearance.   HENT:      Head: Normocephalic.      Nose: Nose normal.      Mouth/Throat:      Mouth: Mucous membranes are moist.   Neck:      Vascular: No carotid bruit.   Cardiovascular:      Rate and Rhythm: Normal rate and regular rhythm.      Pulses: Normal pulses.      Heart sounds: Normal heart sounds.   Pulmonary:      Effort: Pulmonary effort is normal. No respiratory distress.      Breath sounds: Normal breath sounds. No stridor.  No wheezing, rhonchi or rales.   Chest:      Chest wall: No tenderness.   Abdominal:      General: There is no distension.      Palpations: Abdomen is soft. There is no mass.      Tenderness: There is no abdominal tenderness. There is no right CVA tenderness, left CVA tenderness, guarding or rebound.      Hernia: No hernia is present.   Genitourinary:     Comments: Scant blood in vaginal canal.  No CMT no adnexal tenderness.  Patient has small ulcerated lesions on external labia.  No vesicles noted.  No discharge from areas.  Just tender to palpation.  Musculoskeletal:         General: No swelling or tenderness. Normal range of motion.      Cervical back: Normal range of motion. No rigidity or tenderness.   Lymphadenopathy:      Cervical: No cervical adenopathy.   Skin:     General: Skin is warm.      Capillary Refill: Capillary refill takes less than 2 seconds.      Coloration: Skin is not jaundiced or pale.      Findings: No bruising, erythema, lesion or rash.   Neurological:      General: No focal deficit present.      Mental Status: She is alert and oriented to person, place, and time.   Psychiatric:          Behavior: Behavior normal.        MDM  Number of Diagnoses or Management Options  Vaginal discomfort  Diagnosis management comments: Will give cream for ulcerations.  And have her take Motrin.  We will treat for UTI.    Differential diagnosis vaginal ulceration herpes UTI           Procedures

## 2021-08-11 NOTE — ED Notes (Signed)
Chaperone for pelvic exam with MD.    Ulcerations noted to labia that are painful with palpation.    Pt tolerated exam well.

## 2021-09-02 ENCOUNTER — Inpatient Hospital Stay
Admit: 2021-09-02 | Discharge: 2021-09-02 | Disposition: A | Discharge status: Home / Self Care | Payer: TRICARE (CHAMPUS) | Attending: Emergency Medicine

## 2021-09-02 DIAGNOSIS — R112 Nausea with vomiting, unspecified: Secondary | ICD-10-CM

## 2021-09-02 LAB — METABOLIC PANEL, COMPREHENSIVE
A-G Ratio: 1.3 (ref 0.8–1.7)
ALT (SGPT): 14 U/L (ref 13–56)
AST (SGOT): 14 U/L (ref 10–38)
Albumin: 4.5 g/dL (ref 3.4–5.0)
Alk. phosphatase: 102 U/L (ref 45–117)
Anion gap: 5 mmol/L (ref 3.0–18)
BUN/Creatinine ratio: 17 (ref 12–20)
BUN: 13 MG/DL (ref 7.0–18)
Bilirubin, total: 1 MG/DL (ref 0.2–1.0)
CO2: 24 mmol/L (ref 21–32)
Calcium: 9.3 MG/DL (ref 8.5–10.1)
Chloride: 108 mmol/L (ref 100–111)
Creatinine: 0.76 MG/DL (ref 0.6–1.3)
Globulin: 3.5 g/dL (ref 2.0–4.0)
Glucose: 82 mg/dL (ref 74–99)
Potassium: 4.2 mmol/L (ref 3.5–5.5)
Protein, total: 8 g/dL (ref 6.4–8.2)
Sodium: 137 mmol/L (ref 136–145)
eGFR: >60 mL/min/{1.73_m2} (ref >60)

## 2021-09-02 LAB — CBC WITH AUTOMATED DIFF
ABS. BASOPHILS: 0 10*3/uL (ref 0.0–0.1)
ABS. EOSINOPHILS: 0.2 10*3/uL (ref 0.0–0.4)
ABS. IMM. GRANS.: 0 10*3/uL (ref 0.00–0.04)
ABS. LYMPHOCYTES: 2 10*3/uL (ref 0.9–3.6)
ABS. MONOCYTES: 0.9 10*3/uL (ref 0.05–1.2)
ABS. NEUTROPHILS: 6 10*3/uL (ref 1.8–8.0)
ABSOLUTE NRBC: 0 10*3/uL (ref 0.00–0.01)
BASOPHILS: 0 % (ref 0–2)
EOSINOPHILS: 2 % (ref 0–5)
HCT: 42.5 % (ref 35.0–45.0)
HGB: 13.7 g/dL (ref 12.0–16.0)
IMMATURE GRANULOCYTES: 0 % (ref 0.0–0.5)
LYMPHOCYTES: 22 % (ref 21–52)
MCH: 28.4 PG (ref 24.0–34.0)
MCHC: 32.2 g/dL (ref 31.0–37.0)
MCV: 88.2 FL (ref 78.0–100.0)
MONOCYTES: 10 % (ref 3–10)
MPV: 10.7 FL (ref 9.2–11.8)
NEUTROPHILS: 66 % (ref 40–73)
NRBC: 0 PER 100 WBC
PLATELET: 289 10*3/uL (ref 135–420)
RBC: 4.82 M/uL (ref 4.20–5.30)
RDW: 13.2 % (ref 11.6–14.5)
WBC: 9.2 10*3/uL (ref 4.6–13.2)

## 2021-09-02 LAB — LIPASE
Lipase: 133 U/L (ref 73–393)
Lipase: 133 U/L (ref 73–393)

## 2021-09-02 LAB — URINALYSIS W/ RFLX MICROSCOPIC
Bilirubin, Urine: NEGATIVE
Bilirubin: NEGATIVE
Glucose, Ur: NEGATIVE mg/dL
Glucose: NEGATIVE mg/dL
Nitrite, Urine: NEGATIVE
Nitrites: NEGATIVE
Specific Gravity, UA: 1.025 (ref 1.005–1.030)
Specific gravity: 1.025 (ref 1.005–1.030)
Urobilinogen, UA, POCT: 0.2 EU/dL (ref 0.2–1.0)
Urobilinogen: 0.2 EU/dL (ref 0.2–1.0)
pH (UA): 5 (ref 5.0–8.0)
pH, UA: 5 (ref 5.0–8.0)

## 2021-09-02 LAB — URINE MICROSCOPIC ONLY
RBC, UA: 0 /hpf (ref 0–5)
RBC: 0 /hpf (ref 0–5)
WBC, UA: 11 /hpf (ref 0–5)
WBC: 11 /hpf (ref 0–5)

## 2021-09-02 LAB — HCG URINE, QL
HCG urine, QL: NEGATIVE
Pregnancy Test(Urn): NEGATIVE

## 2021-09-02 LAB — COMPREHENSIVE METABOLIC PANEL
ALT: 14 U/L (ref 13–56)
AST: 14 U/L (ref 10–38)
Albumin/Globulin Ratio: 1.3 (ref 0.8–1.7)
Albumin: 4.5 g/dL (ref 3.4–5.0)
Alkaline Phosphatase: 102 U/L (ref 45–117)
Anion Gap: 5 mmol/L (ref 3.0–18)
BUN: 13 MG/DL (ref 7.0–18)
Bun/Cre Ratio: 17 (ref 12–20)
CO2: 24 mmol/L (ref 21–32)
Calcium: 9.3 MG/DL (ref 8.5–10.1)
Chloride: 108 mmol/L (ref 100–111)
Creatinine: 0.76 MG/DL (ref 0.6–1.3)
ESTIMATED GLOMERULAR FILTRATION RATE: >60 mL/min/{1.73_m2} (ref >60)
Globulin: 3.5 g/dL (ref 2.0–4.0)
Glucose: 82 mg/dL (ref 74–99)
Potassium: 4.2 mmol/L (ref 3.5–5.5)
Sodium: 137 mmol/L (ref 136–145)
Total Bilirubin: 1 MG/DL (ref 0.2–1.0)
Total Protein: 8 g/dL (ref 6.4–8.2)

## 2021-09-02 LAB — CBC WITH AUTO DIFFERENTIAL
Basophils %: 0 % (ref 0–2)
Basophils Absolute: 0 10*3/uL (ref 0.0–0.1)
Eosinophils %: 2 % (ref 0–5)
Eosinophils Absolute: 0.2 10*3/uL (ref 0.0–0.4)
Granulocyte Absolute Count: 0 10*3/uL (ref 0.00–0.04)
Hematocrit: 42.5 % (ref 35.0–45.0)
Hemoglobin: 13.7 g/dL (ref 12.0–16.0)
Immature Granulocytes: 0 % (ref 0.0–0.5)
Lymphocytes %: 22 % (ref 21–52)
Lymphocytes Absolute: 2 10*3/uL (ref 0.9–3.6)
MCH: 28.4 PG (ref 24.0–34.0)
MCHC: 32.2 g/dL (ref 31.0–37.0)
MCV: 88.2 FL (ref 78.0–100.0)
MPV: 10.7 FL (ref 9.2–11.8)
Monocytes %: 10 % (ref 3–10)
Monocytes Absolute: 0.9 10*3/uL (ref 0.05–1.2)
NRBC Absolute: 0 10*3/uL (ref 0.00–0.01)
Neutrophils %: 66 % (ref 40–73)
Neutrophils Absolute: 6 10*3/uL (ref 1.8–8.0)
Nucleated RBCs: 0 PER 100 WBC
Platelets: 289 10*3/uL (ref 135–420)
RBC: 4.82 M/uL (ref 4.20–5.30)
RDW: 13.2 % (ref 11.6–14.5)
WBC: 9.2 10*3/uL (ref 4.6–13.2)

## 2021-09-02 MED ORDER — FAMOTIDINE (PF) 20 MG/2 ML IV
20 mg/2 mL | INTRAVENOUS | Status: AC
Start: 2021-09-02 — End: 2021-09-02
  Administered 2021-09-02: 15:00:00 via INTRAVENOUS

## 2021-09-02 MED ORDER — ONDANSETRON 4 MG TAB, RAPID DISSOLVE
4 mg | ORAL_TABLET | Freq: Three times a day (TID) | ORAL | 0 refills | Status: AC | PRN
Start: 2021-09-02 — End: ?

## 2021-09-02 MED ORDER — ONDANSETRON (PF) 4 MG/2 ML INJECTION
4 mg/2 mL | INTRAMUSCULAR | Status: AC
Start: 2021-09-02 — End: 2021-09-02
  Administered 2021-09-02: 15:00:00 via INTRAVENOUS

## 2021-09-02 MED ORDER — LACTATED RINGERS BOLUS IV
Freq: Once | INTRAVENOUS | Status: AC
Start: 2021-09-02 — End: 2021-09-02
  Administered 2021-09-02: 15:00:00 via INTRAVENOUS

## 2021-09-02 MED FILL — FAMOTIDINE (PF) 20 MG/2 ML IV: 20 mg/2 mL | INTRAVENOUS | Qty: 2

## 2021-09-02 MED FILL — ONDANSETRON (PF) 4 MG/2 ML INJECTION: 4 mg/2 mL | INTRAMUSCULAR | Qty: 2

## 2021-09-02 MED FILL — LACTATED RINGERS IV: INTRAVENOUS | Qty: 1000

## 2021-09-02 NOTE — ED Provider Notes (Addendum)
ED Provider Notes by Richardine Service, PA-C at 09/02/21 0277                Author: Selinda Flavin  Service: Emergency Medicine  Author Type: Physician Assistant       Filed: 09/02/21 1202  Date of Service: 09/02/21 0913  Status: Attested           Editor: Selinda Flavin (Physician Assistant)  Cosigner: Alveria Apley, DO at 09/03/21 1736          Attestation signed by Alveria Apley, DO at 09/03/21 1736          Attending Physician Addendum:      This patient was evaluated separately by the midlevel provider, Dionne Bucy, PA-C. I did not personally evaluate the patient but was immediately available for consultation if needed. After review of patient's chart, vital signs, results of objective  studies and H&P note, I agree with management above. My impression is Acute Nausea/Vomiting, Generalized Abdominal Pain, probable Infectious Gastroenteritis. Also doubt Appendicitis. Agree with holding off on Abx for UA abnormalities.      Alveria Apley, DO   [facility]   Emergency Physician                                 EMERGENCY DEPARTMENT HISTORY AND PHYSICAL EXAM      Date: 09/02/2021   Patient Name: Jaclyn Jimenez        History of Presenting Illness          Chief Complaint       Patient presents with        ?  Nausea     ?  Vomiting        ?  Abdominal Pain              History Provided By: Patient      Chief Complaint: n/v/d, abdominal pain      HPI(Context):    9:13 AM   Jaclyn Jimenez is a 20 y.o. female with no PMH who presents to the emergency department C/O n/v/d. Associated sxs include generalized abdominal pain and belching. Sxs started one day ago after eating [place]. Pt notes watery diarrhea.  Several episodes of nausea/vomiting. Pt notes no sick contacts. No at home tx. Pt denies fever, myalgias, back pain, chance of pregnancy, and any other sxs or complaints.       PCP: Other, Phys, MD        Current Outpatient Medications          Medication  Sig   Dispense  Refill           ?  ondansetron (ZOFRAN ODT) 4 mg disintegrating tablet  Take 1 Tablet by mouth every eight (8) hours as needed for Nausea or Vomiting.  12 Tablet  0             Past History        Past Medical History:   No past medical history on file.      Past Surgical History:   No past surgical history on file.      Family History:   No family history on file.      Social History:     Social History          Tobacco Use         ?  Smoking status:  Never     ?  Smokeless tobacco:  Never       Substance Use Topics         ?  Alcohol use:  Not Currently         ?  Drug use:  Not Currently           Allergies:   No Known Allergies           Review of Systems     Review of Systems    Constitutional:  Positive for appetite change. Negative for chills and fever.    Gastrointestinal:  Positive for abdominal pain, blood in stool , diarrhea, nausea and  vomiting.    Musculoskeletal:  Negative for back pain.    Allergic/Immunologic: Negative for immunocompromised state.    All other systems reviewed and are negative.        Physical Exam          Vitals:             09/02/21 1116  09/02/21 1131  09/02/21 1146  09/02/21 1201           BP:  100/62  102/60  99/62  103/67     Pulse:             Resp:             Temp:             SpO2:  100%  100%  100%  100%     Weight:                   Height:                Physical Exam   Vitals and nursing note reviewed.    Constitutional:        General: She is not in acute distress.      Appearance: She is well-developed. She is not diaphoretic.       Comments: Hispanic female in NAD. Alert. Appears comfortable.     HENT:       Head: Normocephalic and atraumatic.       Right Ear: External ear normal.       Left Ear: External ear normal.       Nose: Nose normal.    Eyes:       General: No scleral icterus.         Right eye: No discharge.          Left eye: No discharge.       Conjunctiva/sclera: Conjunctivae normal.     Cardiovascular:       Rate and Rhythm: Normal rate and  regular rhythm.       Heart sounds: Normal heart sounds. No murmur heard.     No friction rub. No gallop.    Pulmonary:       Effort: Pulmonary effort is normal. No tachypnea, accessory muscle usage or respiratory distress.       Breath sounds: Normal breath sounds. No decreased breath sounds, wheezing, rhonchi or rales.     Abdominal:       Palpations: Abdomen is soft.       Tenderness: There is no abdominal tenderness. There is no guarding or rebound. Negative signs include Murphy's sign and McBurney's sign.     Musculoskeletal:          General: Normal range of motion.       Cervical back: Normal range of motion.    Skin:  General: Skin is warm and dry.    Neurological:       Mental Status: She is alert and oriented to person, place, and time.    Psychiatric:          Judgment: Judgment normal.                  Diagnostic Study Results        Labs -         Recent Results (from the past 12 hour(s))     URINALYSIS W/ RFLX MICROSCOPIC          Collection Time: 09/02/21  9:15 AM         Result  Value  Ref Range            Color  YELLOW          Appearance  CLOUDY          Specific gravity  1.025  1.005 - 1.030         pH (UA)  5.0  5.0 - 8.0         Protein  TRACE (A)  NEG mg/dL       Glucose  Negative  NEG mg/dL       Ketone  TRACE (A)  NEG mg/dL       Bilirubin  Negative  NEG         Blood  SMALL (A)  NEG         Urobilinogen  0.2  0.2 - 1.0 EU/dL       Nitrites  Negative  NEG         Leukocyte Esterase  SMALL (A)  NEG         HCG URINE, QL          Collection Time: 09/02/21  9:15 AM         Result  Value  Ref Range            HCG urine, QL  Negative  NEG         URINE MICROSCOPIC ONLY          Collection Time: 09/02/21  9:15 AM         Result  Value  Ref Range            WBC  11 to 20  0 - 5 /hpf       RBC  0 to 3  0 - 5 /hpf       Epithelial cells  4+  0 - 5 /lpf       Bacteria  2+ (A)  NEG /hpf       Mucus  FEW (A)  NEG /lpf       CBC WITH AUTOMATED DIFF          Collection Time: 09/02/21 10:36 AM          Result  Value  Ref Range            WBC  9.2  4.6 - 13.2 K/uL       RBC  4.82  4.20 - 5.30 M/uL       HGB  13.7  12.0 - 16.0 g/dL       HCT  42.5  35.0 - 45.0 %       MCV  88.2  78.0 - 100.0 FL       MCH  28.4  24.0 - 34.0 PG  MCHC  32.2  31.0 - 37.0 g/dL       RDW  13.2  11.6 - 14.5 %       PLATELET  289  135 - 420 K/uL       MPV  10.7  9.2 - 11.8 FL       NRBC  0.0  0 PER 100 WBC       ABSOLUTE NRBC  0.00  0.00 - 0.01 K/uL       NEUTROPHILS  66  40 - 73 %       LYMPHOCYTES  22  21 - 52 %       MONOCYTES  10  3 - 10 %       EOSINOPHILS  2  0 - 5 %       BASOPHILS  0  0 - 2 %       IMMATURE GRANULOCYTES  0  0.0 - 0.5 %       ABS. NEUTROPHILS  6.0  1.8 - 8.0 K/UL       ABS. LYMPHOCYTES  2.0  0.9 - 3.6 K/UL       ABS. MONOCYTES  0.9  0.05 - 1.2 K/UL       ABS. EOSINOPHILS  0.2  0.0 - 0.4 K/UL       ABS. BASOPHILS  0.0  0.0 - 0.1 K/UL       ABS. IMM. GRANS.  0.0  0.00 - 0.04 K/UL       DF  AUTOMATED          METABOLIC PANEL, COMPREHENSIVE          Collection Time: 09/02/21 10:36 AM         Result  Value  Ref Range            Sodium  137  136 - 145 mmol/L       Potassium  4.2  3.5 - 5.5 mmol/L       Chloride  108  100 - 111 mmol/L       CO2  24  21 - 32 mmol/L       Anion gap  5  3.0 - 18 mmol/L       Glucose  82  74 - 99 mg/dL       BUN  13  7.0 - 18 MG/DL       Creatinine  0.76  0.6 - 1.3 MG/DL       BUN/Creatinine ratio  17  12 - 20         eGFR  >60  >60 ml/min/1.46m       Calcium  9.3  8.5 - 10.1 MG/DL       Bilirubin, total  1.0  0.2 - 1.0 MG/DL       ALT (SGPT)  14  13 - 56 U/L       AST (SGOT)  14  10 - 38 U/L       Alk. phosphatase  102  45 - 117 U/L       Protein, total  8.0  6.4 - 8.2 g/dL       Albumin  4.5  3.4 - 5.0 g/dL       Globulin  3.5  2.0 - 4.0 g/dL       A-G Ratio  1.3  0.8 - 1.7         LIPASE          Collection  Time: 09/02/21 10:36 AM         Result  Value  Ref Range            Lipase  133  73 - 393 U/L           Radiologic Studies      No orders to display          CT Results  (Last 48  hours)             None                    CXR Results  (Last 48 hours)             None                     Medications given in the ED-     Medications       lactated ringers bolus infusion 1,000 mL (1,000 mL IntraVENous New Bag 09/02/21 1038)     ondansetron (ZOFRAN) injection 4 mg (4 mg IntraVENous Given 09/02/21 1038)       famotidine (PF) (PEPCID) injection 20 mg (20 mg IntraVENous Given 09/02/21 1038)                Medical Decision Making     I am the first provider for this patient.      I reviewed the vital signs, available nursing notes, past medical history, past surgical history, family history and social history.      Vital Signs-Reviewed the patient's vital signs.      Pulse Oximetry Analysis - 98% on RA. NORMAL       Records Reviewed: Nursing Notes      Provider Notes (Medical Decision Making): food borne, viral, gastroenteritis, gastritis, PUD, GERD, colitis. Doubt appy or other serious intraabdominal pathology.       Procedures:   Procedures      ED Course:    9:13 AM Initial assessment performed. The patients presenting problems have been discussed, and they are in agreement with the care plan formulated and outlined with them.  I have encouraged them to ask questions as they arise throughout their visit.        Diagnosis and Disposition           11:57 AM   Feeling better. Labs reassuring. Benign abdominal exam. No pain at McBurney's. Neg Murphy's. No indication for emergent intraabdominal imaging. Suspect viral v food borne. UA with 11-20 WBC but 4+ epithelial  cells so will hold off on ABX that will make diarrhea worse. Bland diet. PO hydration. Reasons to RTED discussed with pt. All questions answered. Pt feels comfortable going home at this time. Pt expressed understanding and she agrees with plan.               1.  Nausea vomiting and diarrhea         2.  Abdominal pain, generalized            PLAN:   1. D/C Home   2.      Current Discharge Medication List                 START taking these  medications          Details        ondansetron (ZOFRAN ODT) 4 mg disintegrating tablet  Take 1 Tablet by mouth every eight (8) hours as needed for Nausea or Vomiting.  Qty: 12 Tablet, Refills: 0   Start date: 09/02/2021                        STOP taking these medications                  clobetasoL (TEMOVATE) 0.05 % topical cream  Comments:    Reason for Stopping:                          3.      Follow-up Information                  Follow up With  Specialties  Details  Why  Contact Info              [facility]        [phone]              [facility] EMERGENCY DEPT  Emergency Medicine      [place]   [phone]                  _______________________________      Attestations:   This note is prepared by Dionne Bucy, PA-C.   _______________________________            Please note that this dictation was completed with Dragon, the computer voice recognition software.  Quite often unanticipated grammatical, syntax, homophones, and other interpretive errors are  inadvertently transcribed by the computer software.  Please disregard these errors.  Please excuse any errors that have escaped final proofreading.

## 2021-09-02 NOTE — ED Notes (Signed)
 Pt arrives ambulatory to ED with c\o n/vd, abd pain x 1 day

## 2021-09-04 LAB — CULTURE, URINE
Culture result:: NO GROWTH
Culture: NO GROWTH

## 2021-11-27 ENCOUNTER — Inpatient Hospital Stay
Admit: 2021-11-27 | Discharge: 2021-11-27 | Disposition: A | Discharge status: Home / Self Care | Payer: TRICARE (CHAMPUS) | Attending: Emergency Medicine

## 2021-11-27 DIAGNOSIS — L03211 Cellulitis of face: Secondary | ICD-10-CM

## 2021-11-27 MED ORDER — DOXYCYCLINE HYCLATE 100 MG TAB
100 mg | ORAL_TABLET | Freq: Two times a day (BID) | ORAL | 0 refills | Status: AC
Start: 2021-11-27 — End: 2021-12-07

## 2021-11-27 NOTE — ED Provider Notes (Signed)
ED Provider Notes by Weston Settle, PA-C at 11/27/21 1125                Author: Earl Gala  Service: Emergency Medicine  Author Type: Physician Assistant       Filed: 11/27/21 1920  Date of Service: 11/27/21 1125  Status: Attested           Editor: Earl Gala (Physician Assistant)  Cosigner: Darlyne Russian, MD at 11/30/21 2212          Attestation signed by Darlyne Russian, MD at 11/30/21 2212          This patient was seen, evaluated, treated, and discharged independently by the APP.       Darlyne Russian, MD                              EMERGENCY DEPARTMENT HISTORY AND PHYSICAL EXAM      Date: 11/27/2021   Patient Name: Jaclyn Jimenez        History of Presenting Illness          Chief Complaint       Patient presents with        ?  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 Associated sxs include redness and swelling to face. Sxs x 1 day. Pt "popped" a pimple one day ago on left cheek. Sxs worsened  today. Pt is not DM and is immunocompetent.  Pt denies fever, chills, nausea, vomiting, and any other sxs or complaints.       PCP: [facility]        Current Outpatient Medications          Medication  Sig  Dispense  Refill           ?  doxycycline (VIBRA-TABS) 100 mg tablet  Take 1 Tablet by mouth two (2) times a day for 10 days. Indications: an infection of the skin and the tissue below the skin  20 Tablet  0           ?  ondansetron (ZOFRAN ODT) 4 mg disintegrating tablet  Take 1 Tablet by mouth every eight (8) hours as needed for Nausea or Vomiting.  12 Tablet  0             Past History        Past Medical History:   History reviewed. No pertinent past medical history.      Past Surgical History:   No pertinent past surgical history      Family History:    History reviewed. No pertinent family history.      Social History:     Social History          Tobacco Use         ?  Smoking status:  Never     ?  Smokeless tobacco:  Never       Substance Use Topics         ?  Alcohol use:  Not Currently         ?  Drug use:  Not Currently  Allergies:   No Known Allergies           Review of Systems     Review of Systems    Constitutional:  Negative for chills and fever.    HENT:  Positive for facial swelling.     Gastrointestinal:  Negative for nausea and vomiting.    Skin:  Positive for color change.    Allergic/Immunologic: Negative for immunocompromised state.         Physical Exam          Vitals:          11/27/21 1108        BP:  (!) 105/52     Pulse:  69     Resp:  18     Temp:  97.7 ??F (36.5 ??C)     SpO2:  99%     Weight:  47.6 kg (105 lb)        Height:   (1.499 m)        Physical Exam   Vitals and nursing note reviewed.    Constitutional:        General: She is not in acute distress.      Appearance: She is well-developed. She is not diaphoretic.       Comments: Adult female in NAD. Alert. Appears comfortable.     HENT:       Head: Normocephalic and atraumatic.       Jaw: No trismus.       Comments: Mild erythema, swelling and tenderness to left cheek extending to left infraorbital area. No periorbital erythema. No bogginess. There is drained healing cystic acne lesion on left cheek      Right Ear: External ear normal.       Left Ear: External ear normal.       Nose: Nose normal.    Eyes:       General: No scleral icterus.         Right eye: No discharge.          Left eye: No discharge.       Conjunctiva/sclera: Conjunctivae normal.     Cardiovascular:       Rate and Rhythm: Normal rate and regular rhythm.       Heart sounds: Normal heart sounds. No murmur heard.     No friction rub. No gallop.    Pulmonary:       Effort: Pulmonary effort is normal. No tachypnea, accessory muscle usage or respiratory distress.       Breath sounds: Normal breath sounds.  No decreased breath sounds, wheezing, rhonchi or rales.     Musculoskeletal:          General: Normal range of motion.       Cervical back: Normal range of motion.     Lymphadenopathy:       Head:       Right side of head: No submental, submandibular, tonsillar, preauricular, posterior auricular or occipital adenopathy.       Left side of head: No submental, tonsillar, preauricular, posterior auricular or occipital adenopathy.       Cervical: No cervical adenopathy.    Skin:      General: Skin is warm and dry.    Neurological:       Mental Status: She is alert and oriented to person, place, and time.    Psychiatric:          Judgment: Judgment normal.  Diagnostic Study Results        Labs -    No results found for this or any previous visit (from the past 12 hour(s)).              No orders to display          CT Results  (Last 48 hours)             None                    CXR Results  (Last 48 hours)             None                     Medications given in the ED-   Medications - No data to display           Medical Decision Making     I am the first provider for this patient.      I reviewed the vital signs, available nursing notes, past medical history, past surgical history, family history and social history.      Vital Signs-Reviewed the patient's vital signs.      Pulse Oximetry Analysis - 99% on RA. NORMAL       Records Reviewed: Nursing Notes      Provider Notes (Medical Decision Making): cellulitis, cystic acne, infected sebaceous cyst. No abscess. Nothing to I&D. No evidence of periorbital cellulitis or deep infectious process.       Procedures:   Procedures      ED Course:    11:25 AM Initial assessment performed. The patients presenting problems have been discussed, and they are in agreement with the care plan formulated and outlined with them.  I have encouraged them to ask questions as they arise throughout their visit.        Diagnosis and Disposition           See MDM above. Will tx  with ABX. Warm compresses. No more popping pimples. Pt is not DM and is immunocompetent. Reasons to RTED discussed with pt. All questions answered. Pt feels comfortable going home at this time. Pt expressed understanding and she  agrees with plan.               1.  Facial cellulitis         2.  Acne vulgaris            PLAN:   1. D/C Home   2.      Discharge Medication List as of 11/27/2021 12:07 PM                 START taking these medications          Details        doxycycline (VIBRA-TABS) 100 mg tablet  Take 1 Tablet by mouth two (2) times a day for 10 days. Indications: an infection of the skin and the tissue below the skin, Normal, Disp-20 Tablet, R-0                        CONTINUE these medications which have NOT CHANGED          Details        ondansetron (ZOFRAN ODT) 4 mg disintegrating tablet  Take 1 Tablet by mouth every eight (8) hours as needed for Nausea or Vomiting., Normal, Disp-12 Tablet, R-0  3.      Follow-up Information                  Follow up With  Specialties  Details  Why  Contact Info              [facility]        [phone]              [facility] EMERGENCY DEPT  Emergency Medicine      [address]   [place] [address]   [phone]                  _______________________________      Attestations:   This note is prepared by Cassandria Anger, PA-C.   _______________________________            Please note that this dictation was completed with Dragon, the computer voice recognition software.  Quite often unanticipated grammatical, syntax, homophones, and other interpretive errors are  inadvertently transcribed by the computer software.  Please disregard these errors.  Please excuse any errors that have escaped final proofreading.  [place]

## 2021-11-27 NOTE — ED Notes (Signed)
I have reviewed discharge instructions with the patient.  The patient verbalized understanding.  Patient armband removed and shredded

## 2021-11-27 NOTE — ED Notes (Signed)
Pt ambulatory into ER c/o L cheek/eye swelling. Pt states she had a pimple that she popped yesterday and now today cheek is slightly swollen.

## 2022-03-03 ENCOUNTER — Inpatient Hospital Stay
Admit: 2022-03-03 | Discharge: 2022-03-03 | Disposition: A | Discharge status: Home / Self Care | Payer: TRICARE (CHAMPUS) | Attending: Emergency Medicine

## 2022-03-03 DIAGNOSIS — O211 Hyperemesis gravidarum with metabolic disturbance: Secondary | ICD-10-CM

## 2022-03-03 DIAGNOSIS — O210 Mild hyperemesis gravidarum: Secondary | ICD-10-CM

## 2022-03-03 LAB — BASIC METABOLIC PANEL
Anion Gap: 4 mmol/L (ref 3.0–18)
BUN: 9 MG/DL (ref 7.0–18)
Bun/Cre Ratio: 21 — ABNORMAL HIGH (ref 12–20)
CO2: 25 mmol/L (ref 21–32)
Calcium: 9.1 MG/DL (ref 8.5–10.1)
Chloride: 108 mmol/L (ref 100–111)
Creatinine: 0.42 MG/DL — ABNORMAL LOW (ref 0.6–1.3)
Est, Glom Filt Rate: >60 mL/min/{1.73_m2} (ref >60)
Glucose: 75 mg/dL (ref 74–99)
Potassium: 3.5 mmol/L (ref 3.5–5.5)
Sodium: 137 mmol/L (ref 136–145)

## 2022-03-03 LAB — MAGNESIUM: Magnesium: 2 mg/dL (ref 1.6–2.6)

## 2022-03-03 MED ORDER — ONDANSETRON 4 MG PO TBDP
4 MG | ORAL_TABLET | Freq: Three times a day (TID) | ORAL | 0 refills | Status: AC | PRN
Start: 2022-03-03 — End: 2022-08-04

## 2022-03-03 MED ORDER — ONDANSETRON HCL 4 MG/2ML IJ SOLN
4 MG/2ML | Freq: Once | INTRAMUSCULAR | Status: AC
Start: 2022-03-03 — End: 2022-03-03
  Administered 2022-03-03: 21:00:00 4 mg via INTRAVENOUS

## 2022-03-03 MED ORDER — SODIUM CHLORIDE 0.9 % IV BOLUS
0.9 % | Freq: Once | INTRAVENOUS | Status: AC
Start: 2022-03-03 — End: 2022-03-03
  Administered 2022-03-03: 21:00:00 1000 mL via INTRAVENOUS

## 2022-03-03 MED ORDER — ONDANSETRON 4 MG PO TBDP
4 MG | Freq: Once | ORAL | Status: DC
Start: 2022-03-03 — End: 2022-03-03

## 2022-03-03 MED FILL — SODIUM CHLORIDE 0.9 % IV SOLN: 0.9 % | INTRAVENOUS | Qty: 1000

## 2022-03-03 MED FILL — ONDANSETRON HCL 4 MG/2ML IJ SOLN: 4 MG/2ML | INTRAMUSCULAR | Qty: 2

## 2022-03-03 NOTE — Discharge Instructions (Signed)
Well-hydrated  Healthy diet  You can use over-the-counter Benadryl to help with nausea as it is safe in pregnancy  Zofran was prescribed, take as needed for nausea  Follow-up with your OB as directed  Return to ER if new or worsening symptoms or new concerns

## 2022-03-03 NOTE — ED Notes (Signed)
PO challenge pt able to tolerate water.      Janice Norrie, RN  03/03/22 [phone]

## 2022-03-03 NOTE — ED Provider Notes (Signed)
EMERGENCY DEPARTMENT HISTORY & PHYSICAL EXAM    [facility] EMERGENCY DEPT  03/03/2022, 4:02 PM EDT    Clinical Impression:  1. Hyperemesis gravidarum        Assessment/Differential Diagnosis:     Ddx early pregnancy, hyperemesis, dehydration, electrolyte abnormality, hypokalemia all considered    ED Course:   Initial assessment performed. The patients presenting problems have been discussed, and they are in agreement with the care plan formulated and outlined with them.  I have encouraged them to ask questions as they arise throughout their visit.    Pt here with concern for hypokalemia. Pt seen by her primary care provider 2 days ago.  Blood work was drawn and sent for lymphadenopathy.  She was called today and said her potassium was 2.8 and should be evaluated in the ED.  Patient's last menstrual cycle was the end of January.  She was seen by her OB for the first time yesterday.  She states she had blood work, pelvic exam and ultrasound.  There was no concerns at that visit.  She did express to them that she is nauseous most of the day and vomits several times a day.  No medication was prescribed or recommended per patient.  Is been no fever, chills, abdominal pain, vaginal bleeding, urinary symptoms.  Analysis done yesterday was normal per patient.  Denies any abdominal trauma, diarrhea.  G1, P0 with an EDC of November 1  No chronic medical problems and no chronic medications    Exam with benign abd exam, vitals reassuring.  Pt does not appear dehydrated    Labs today with normal CBC, BMP, Mg+  Pt tolerated PO after zofran, feeling well.      Medical Chart Review:  I have reviewed triage nursing documentation.  Review of old medical records with the following pertinent information:       Disposition:  Home  in good condition.      Chief Complaint   Patient presents with    Emesis During Pregnancy    Abnormal Lab     HPI:    The history is provided by patient. No language interpreter  used.    Jaclyn Jimenez is a 21 y.o. female presenting to the Emergency Department with complaints of low potassium. Pt here with concern for hypokalemia. Pt seen by her primary care provider 2 days ago.  Blood work was drawn and sent for lymphadenopathy.  She was called today and said her potassium was 2.8 and should be evaluated in the ED.  Patient's last menstrual cycle was the end of January.  She was seen by her OB for the first time yesterday.  She states she had blood work, pelvic exam and ultrasound.  There was no concerns at that visit.  She did express to them that she is nauseous most of the day and vomits several times a day.  No medication was prescribed or recommended per patient.  Is been no fever, chills, abdominal pain, vaginal bleeding, urinary symptoms.  Analysis done yesterday was normal per patient.  Denies any abdominal trauma, diarrhea.  G1, P0 with an EDC of November 1  No chronic medical problems and no chronic medications      I have reviewed all PMHX, FMHX and Social Hx as entered into the medical record in the chart below using the Epic Template.    Review of Systems:  Constitutional:  Neg for fever,  chills, weight changes.  ENT:  Neg for Sore throat, runny nose,  or other URI symptoms  Respiratory:  neg for cough, no shortness of breath, or wheezing  Cardiovascular:  No chest pain, chest pressure, palpitations.  GI:  + daily vomiting, no diarrhea, no abdominal pain.  GU: no dysuria, no frequency, no urgency. No Flank pain.  MSK no joint pain, no myalgias  Integumentary: no rashes, lesions, or skin irritation.  Neurological: no headaches, dizziness  All other systems reviewed negative except was is positive in ROS and HPI.    Past Medical History:  History reviewed. No pertinent past medical history.    Past Surgical History:  History reviewed. No pertinent surgical history.    Family History:  History reviewed. No pertinent family history.    Social History:  Social  History     Tobacco Use    Smoking status: Never    Smokeless tobacco: Never   Substance Use Topics    Alcohol use: Not Currently    Drug use: Not Currently       Allergies:  No Known Allergies    Vital Signs:  Vitals:    03/03/22 1512   BP: (!) 107/53   Pulse: 87   Resp: 12   Temp: 98 F (36.7 C)   TempSrc: Temporal   SpO2: 99%   Weight: 101 lb (45.8 kg)   Height: 4\' 11"  (1.499 m)     Physical Exam:  Vital Signs Reviewed. Nursing Notes Reviewed.  Constitutional:  Well developed, well nourished patient. Appearance and behavior are age and situation appropriate.  Head: Normocephalic, Atraumatic  Eyes: Conjunctiva clear, lids normal. Sclera anicteric. PERRL.  ENT:  gross hearing normal, throat normal   Neck:  Supple, FROM. Trachea midline. No nuchal rigidity.   Lungs: Lungs CTAB. No wheezes, rales or rhonchi. No respiratory distress, tachypnea or accessory muscle use. No cough.  Cardiac:  RRR without murmur. No peripheral edema  Abdomen: soft, nontender, normal bowel sounds, no mass. Skin without rash or signs of trauma.  Extremities:  no pedal edema  Neuro:  A&O , no obvious neuro deficit with general inspection.  Skin:  Warm, dry, no rash  Back:  No CVAT    Diagnostics:    Labs -     Recent Results (from the past 12 hour(s))   Basic Metabolic Panel    Collection Time: 03/03/22  4:10 PM   Result Value Ref Range    Sodium 137 136 - 145 mmol/L    Potassium 3.5 3.5 - 5.5 mmol/L    Chloride 108 100 - 111 mmol/L    CO2 25 21 - 32 mmol/L    Anion Gap 4 3.0 - 18 mmol/L    Glucose 75 74 - 99 mg/dL    BUN 9 7.0 - 18 MG/DL    Creatinine 05/03/22 (L) 0.6 - 1.3 MG/DL    Bun/Cre Ratio 21 (H) 12 - 20      Est, Glom Filt Rate >60 >60 ml/min/1.57m2    Calcium 9.1 8.5 - 10.1 MG/DL   Magnesium    Collection Time: 03/03/22  4:10 PM   Result Value Ref Range    Magnesium 2.0 1.6 - 2.6 mg/dL       Radiologic Studies -   No orders to display       Medications given in the ED-  Medications   0.9 % sodium chloride bolus (1,000 mLs IntraVENous  New Bag 03/03/22 1634)   ondansetron (ZOFRAN) injection 4 mg (4 mg IntraVENous Given 03/03/22 1634)  Please note that this dictation was completed with Dragon, the computer voice recognition software.  Quite often unanticipated grammatical, syntax, homophones, and other interpretive errors are inadvertently transcribed by the computer software.  Please disregard these errors.  Please excuse any errors that have escaped final proofreading.       Wayne BothLisa A Imanuel Pruiett, PA-C  03/03/22 1739

## 2022-03-03 NOTE — ED Triage Notes (Signed)
Patient reports she is [redacted] weeks pregnant and vomits every day. Today she saw her dr and they drew labs and they told her the K+2.8

## 2022-07-03 ENCOUNTER — Inpatient Hospital Stay: Payer: TRICARE (CHAMPUS)

## 2022-07-03 LAB — POCT URINALYSIS DIPSTICK
Bilirubin, Urine, POC: NEGATIVE
Glucose, UA POC: 100 mg/dL — AB
Ketones, Urine, POC: NEGATIVE mg/dL
Leukocyte Est, UA POC: NEGATIVE
Nitrite, Urine, POC: NEGATIVE
Protein, Urine, POC: NEGATIVE mg/dL
Specific Gravity, Urine, POC: 1.015 (ref 1.001–1.023)
URINE UROBILINOGEN POC: 0.2 EU/dL (ref 0.2–1.0)
pH, Urine, POC: 6.5 (ref 5.0–9.0)

## 2022-07-03 NOTE — Progress Notes (Addendum)
1401 Patient is a G2P0 at 27.3 weeks who presents to unit with concern that she cannot fully empty her bladder upon voiding. Patient denies pain/contractions/LOF. Good FM reported. Patient unable to void at this time, given PO hydration.     1425 Rn at bedside. Increased FM heard, [place] adjusted.     1445 Dr. Zacarias Pontes paged. Prompt return call. MD updated on patient, assessment, VS, and UA results. FHT appropriate for GA. Patient may be discharged home, send urine for culture, and have patient follow up with her OB.    1451 Discharge instructions reviewed. Patient verbalizes understanding and denies needs.     1453 Patient ambulates off unit at this time.

## 2022-07-05 LAB — CULTURE, URINE: Culture: NO GROWTH

## 2022-08-04 ENCOUNTER — Emergency Department: Admit: 2022-08-04 | Payer: TRICARE (CHAMPUS) | Primary: Diagnostic Radiology

## 2022-08-04 ENCOUNTER — Inpatient Hospital Stay: Payer: TRICARE (CHAMPUS) | Attending: Obstetrics & Gynecology

## 2022-08-04 ENCOUNTER — Inpatient Hospital Stay
Admit: 2022-08-04 | Discharge: 2022-08-04 | Disposition: A | Discharge status: Home / Self Care | Payer: TRICARE (CHAMPUS) | Attending: Emergency Medicine

## 2022-08-04 DIAGNOSIS — O26893 Other specified pregnancy related conditions, third trimester: Secondary | ICD-10-CM

## 2022-08-04 DIAGNOSIS — R0602 Shortness of breath: Secondary | ICD-10-CM

## 2022-08-04 LAB — CBC WITH AUTO DIFFERENTIAL
Absolute Immature Granulocyte: 0 10*3/uL (ref 0.00–0.04)
Basophils %: 0 % (ref 0–2)
Basophils Absolute: 0 10*3/uL (ref 0.0–0.1)
Eosinophils %: 1 % (ref 0–5)
Eosinophils Absolute: 0.1 10*3/uL (ref 0.0–0.4)
Hematocrit: 30.7 % — ABNORMAL LOW (ref 35.0–45.0)
Hemoglobin: 10.4 g/dL — ABNORMAL LOW (ref 12.0–16.0)
Immature Granulocytes: 0 % (ref 0.0–0.5)
Lymphocytes %: 28 % (ref 21–52)
Lymphocytes Absolute: 2.5 10*3/uL (ref 0.9–3.6)
MCH: 29.9 PG (ref 24.0–34.0)
MCHC: 33.9 g/dL (ref 31.0–37.0)
MCV: 88.2 FL (ref 78.0–100.0)
MPV: 11.9 FL — ABNORMAL HIGH (ref 9.2–11.8)
Monocytes %: 10 % (ref 3–10)
Monocytes Absolute: 0.9 10*3/uL (ref 0.05–1.2)
Neutrophils %: 60 % (ref 40–73)
Neutrophils Absolute: 5.3 10*3/uL (ref 1.8–8.0)
Nucleated RBCs: 0 PER 100 WBC
Platelets: 183 10*3/uL (ref 135–420)
RBC: 3.48 M/uL — ABNORMAL LOW (ref 4.20–5.30)
RDW: 12.5 % (ref 11.6–14.5)
WBC: 8.9 10*3/uL (ref 4.6–13.2)
nRBC: 0 10*3/uL (ref 0.00–0.01)

## 2022-08-04 LAB — COMPREHENSIVE METABOLIC PANEL
ALT: 20 U/L (ref 13–56)
AST: 26 U/L (ref 10–38)
Albumin/Globulin Ratio: 0.7 — ABNORMAL LOW (ref 0.8–1.7)
Albumin: 2.5 g/dL — ABNORMAL LOW (ref 3.4–5.0)
Alk Phosphatase: 146 U/L — ABNORMAL HIGH (ref 45–117)
Anion Gap: 7 mmol/L (ref 3.0–18)
BUN: 6 MG/DL — ABNORMAL LOW (ref 7.0–18)
Bun/Cre Ratio: 12 (ref 12–20)
CO2: 23 mmol/L (ref 21–32)
Calcium: 8.5 MG/DL (ref 8.5–10.1)
Chloride: 107 mmol/L (ref 100–111)
Creatinine: 0.5 MG/DL — ABNORMAL LOW (ref 0.6–1.3)
Est, Glom Filt Rate: >60 mL/min/{1.73_m2} (ref >60)
Globulin: 3.8 g/dL (ref 2.0–4.0)
Glucose: 84 mg/dL (ref 74–99)
Potassium: 3.5 mmol/L (ref 3.5–5.5)
Sodium: 137 mmol/L (ref 136–145)
Total Bilirubin: 0.8 MG/DL (ref 0.2–1.0)
Total Protein: 6.3 g/dL — ABNORMAL LOW (ref 6.4–8.2)

## 2022-08-04 LAB — TROPONIN
Troponin, High Sensitivity: <3 ng/L (ref 0–54)
Troponin, High Sensitivity: <3 ng/L (ref 0–54)

## 2022-08-04 LAB — MAGNESIUM: Magnesium: 1.6 mg/dL (ref 1.6–2.6)

## 2022-08-04 MED ORDER — METOCLOPRAMIDE HCL 10 MG PO TABS
10 MG | ORAL | Status: AC
Start: 2022-08-04 — End: 2022-08-04
  Administered 2022-08-04: 10:00:00 10 mg via ORAL

## 2022-08-04 MED FILL — METOCLOPRAMIDE HCL 10 MG PO TABS: 10 MG | ORAL | Qty: 1

## 2022-08-04 NOTE — ED Provider Notes (Signed)
[facility] EMERGENCY DEPT  EMERGENCY DEPARTMENT ENCOUNTER    Patient Name: Jaclyn Jimenez  MRN: [id number]  Birth Date: 07-30-2001  Provider: Jeanella Craze, MD  PCP: PROVIDER UNKNOWN   Time/Date of evaluation: 4:53 AM EDT on 08/04/22    History of Presenting Illness     Chief Complaint   Patient presents with   . Shortness of Breath     Patient state that she has not been feeling "well" since yesterday. She states that she has been getting sob easily. Patient states that tonight she woke up out of her sleep sob. Patient is [redacted] weeks pregnant and denies any abdominal pain or vaginal bleeding. She states " im not sure why I am sob".        History Provided by: Patient   History is limited by: Nothing    HISTORY (Narative):   Jaclyn Jimenez is a 21 y.o. female  G1 P0 at 32-0/7, with no contributory PMHx who presents to the emergency department (room 12) by POV C/O shortness of breath onset yesterday. Associated sxs include dyspnea on exertion. Pt denies fevers, chills, coughs or any other sxs or complaints.  Patient states normal fetal movement.    Nursing Notes were all reviewed and agreed with or any disagreements were addressed in the HPI.    Past History     PAST MEDICAL HISTORY:  No past medical history on file.    PAST SURGICAL HISTORY:  No past surgical history on file.    FAMILY HISTORY:  No family history on file.    SOCIAL HISTORY:  Social History     Tobacco Use   . Smoking status: Never   . Smokeless tobacco: Never   Substance Use Topics   . Alcohol use: Not Currently   . Drug use: Not Currently       MEDICATIONS:  No current facility-administered medications for this encounter.     Current Outpatient Medications   Medication Sig Dispense Refill   . Prenatal Vit-Fe Fumarate-FA (PRENATAL PO) Take by mouth     . ondansetron (ZOFRAN-ODT) 4 MG disintegrating tablet Take 1 tablet by mouth 3 times daily as needed for Nausea or Vomiting (Patient not taking: Reported on 07/03/2022) 21  tablet 0   . ondansetron (ZOFRAN-ODT) 4 MG disintegrating tablet Take 4 mg by mouth every 8 hours as needed (Patient not taking: Reported on 03/03/2022)         ALLERGIES:  No Known Allergies    SOCIAL DETERMINANTS OF HEALTH:  Social Determinants of Health     Tobacco Use: Low Risk    . Smoking Tobacco Use: Never   . Smokeless Tobacco Use: Never   . Passive Exposure: Not on file   Alcohol Use: Not At Risk   . Frequency of Alcohol Consumption: Never   . Average Number of Drinks: Patient does not drink   . Frequency of Binge Drinking: Never   Merchant navy officer: Not on file   Food Insecurity: Not on file   Transportation Needs: Not on file   Physical Activity: Not on file   Stress: Not on file   Social Connections: Not on file   Intimate Partner Violence: Not on file   Depression: Not on file   Housing Stability: Not on file   Postpartum Depression: Not on file       Review of Systems     Negative except as listed above in HPI.    Physical Exam  Vitals:    08/04/22 0454   BP: 113/67   Pulse: 75   Resp: 18   Temp: 98 F (36.7 C)   TempSrc: Oral   SpO2: 99%     Physical Exam  Vitals and nursing note reviewed.   Constitutional:       General: She is not in acute distress.     Appearance: Normal appearance. She is normal weight. She is not ill-appearing.   HENT:      Head: Normocephalic and atraumatic.      Nose: Nose normal. No rhinorrhea.      Mouth/Throat:      Mouth: Mucous membranes are moist.      Pharynx: No oropharyngeal exudate or posterior oropharyngeal erythema.   Eyes:      General:         Right eye: No discharge.         Left eye: No discharge.      Extraocular Movements: Extraocular movements intact.      Conjunctiva/sclera: Conjunctivae normal.      Pupils: Pupils are equal, round, and reactive to light.   Cardiovascular:      Rate and Rhythm: Normal rate and regular rhythm.      Heart sounds: No murmur heard.    No friction rub. No gallop.   Pulmonary:      Effort: Pulmonary effort is normal. No  respiratory distress.      Breath sounds: No wheezing, rhonchi or rales.   Abdominal:      General: Bowel sounds are normal.      Palpations: Abdomen is soft.      Tenderness: There is abdominal tenderness. There is no guarding or rebound.      Comments: Gravid uterus approximately 12 cm above umbilicus   Musculoskeletal:         General: No swelling, tenderness or deformity. Normal range of motion.      Cervical back: Normal range of motion and neck supple. No rigidity.   Lymphadenopathy:      Cervical: No cervical adenopathy.   Skin:     General: Skin is warm and dry.      Findings: No rash.   Neurological:      General: No focal deficit present.      Mental Status: She is alert and oriented to person, place, and time.   Psychiatric:         Mood and Affect: Mood normal.         Behavior: Behavior normal.       Diagnostic Study Results     LABS:  Recent Results (from the past 12 hour(s))   Troponin    Collection Time: 08/04/22  5:15 AM   Result Value Ref Range    Troponin, High Sensitivity <3 0 - 54 ng/L   CBC with Auto Differential    Collection Time: 08/04/22  5:15 AM   Result Value Ref Range    WBC 8.9 4.6 - 13.2 K/uL    RBC 3.48 (L) 4.20 - 5.30 M/uL    Hemoglobin 10.4 (L) 12.0 - 16.0 g/dL    Hematocrit 30.7 (L) 35.0 - 45.0 %    MCV 88.2 78.0 - 100.0 FL    MCH 29.9 24.0 - 34.0 PG    MCHC 33.9 31.0 - 37.0 g/dL    RDW 12.5 11.6 - 14.5 %    Platelets 183 135 - 420 K/uL    MPV 11.9 (H) 9.2 - 11.8 FL  Nucleated RBCs 0.0 0 PER 100 WBC    nRBC 0.00 0.00 - 0.01 K/uL    Neutrophils % 60 40 - 73 %    Lymphocytes % 28 21 - 52 %    Monocytes % 10 3 - 10 %    Eosinophils % 1 0 - 5 %    Basophils % 0 0 - 2 %    Immature Granulocytes 0 0.0 - 0.5 %    Neutrophils Absolute 5.3 1.8 - 8.0 K/UL    Lymphocytes Absolute 2.5 0.9 - 3.6 K/UL    Monocytes Absolute 0.9 0.05 - 1.2 K/UL    Eosinophils Absolute 0.1 0.0 - 0.4 K/UL    Basophils Absolute 0.0 0.0 - 0.1 K/UL    Absolute Immature Granulocyte 0.0 0.00 - 0.04 K/UL    Differential  Type AUTOMATED     Comprehensive Metabolic Panel    Collection Time: 08/04/22  5:15 AM   Result Value Ref Range    Sodium 137 136 - 145 mmol/L    Potassium 3.5 3.5 - 5.5 mmol/L    Chloride 107 100 - 111 mmol/L    CO2 23 21 - 32 mmol/L    Anion Gap 7 3.0 - 18 mmol/L    Glucose 84 74 - 99 mg/dL    BUN 6 (L) 7.0 - 18 MG/DL    Creatinine 0.50 (L) 0.6 - 1.3 MG/DL    Bun/Cre Ratio 12 12 - 20      Est, Glom Filt Rate >60 >60 ml/min/1.17m    Calcium 8.5 8.5 - 10.1 MG/DL    Total Bilirubin 0.8 0.2 - 1.0 MG/DL    ALT 20 13 - 56 U/L    AST 26 10 - 38 U/L    Alk Phosphatase 146 (H) 45 - 117 U/L    Total Protein 6.3 (L) 6.4 - 8.2 g/dL    Albumin 2.5 (L) 3.4 - 5.0 g/dL    Globulin 3.8 2.0 - 4.0 g/dL    Albumin/Globulin Ratio 0.7 (L) 0.8 - 1.7     Magnesium    Collection Time: 08/04/22  5:15 AM   Result Value Ref Range    Magnesium 1.6 1.6 - 2.6 mg/dL       RADIOLOGIC STUDIES:   Non x-ray images such as CT, Ultrasound and MRI are read by the radiologist. X-ray images are visualized and preliminarily interpreted by the ED Provider with the findings as listed in the ED Course section below.     Interpretation per the Radiologist is listed below, if available at the time of this note:    XR CHEST PORTABLE   Final Result      No acute process.             Procedures     Procedures    ED Course     4:53 AM EDT I (Jeanella Craze MD) am the first provider for this patient. Initial assessment performed. I reviewed the vital signs, available nursing notes, past medical history, past surgical history, family history and social history. The patients presenting problems have been discussed, and they are in agreement with the care plan formulated and outlined with them.  I have encouraged them to ask questions as they arise throughout their visit.    RECORDS REVIEWED: Nursing Notes    Is this patient to be included in the SEP-1 core measure? No Exclusion criteria - the patient is NOT to be included for SEP-1 Core Measure due to: 2+ SIRS  criteria are not met    MEDICATIONS ADMINISTERED IN THE ED:  Medications - No data to display    ED Course as of 08/04/22 0710   Wed Aug 04, 2022   0509 EKG interpretation:  Rhythm: NSR. Rate: 69 bpm; No STEMI  EKG read by Jeanella Craze, MD [JM]   [place]  Chest X-ray shows no acute cardiopulmonary process.  Interpreted by Jeanella Craze, MD.  Confirmed by Radiologist   [JM]   [phone] EKG interpretation:  Rhythm: NSR. Rate: 69 bpm; No STEMI  EKG read by Jeanella Craze, MD [JM]      ED Course User Index  [JM] Sheran Fava, MD       None    Medical Decision Making     SCREENING TOOLS:  HEART Score:    History:  0: Slightly Suspicious  EKG:  0: Normal  Age:  0: Under 45  Risk Factors:  0: None  None  Troponin 1: < 3 ng/L  Troponin 2: < 3 ng/L    Total: 0  0-3: Low risk: less than 2% risk of Major Adverse Cardiac Event at 6 weeks.  Troponin increase by 1.4x or more: No    MDM:  I utilized an evidence-based risk rating tool (CMT) along with my training and experience to weigh the risk of discharge against the risks of further testing, imaging, or hospitalization. At this time I estimate the risks of additional testing, imaging, or hospitalization to be equal to or greater than the risk of discharge. I discussed my risk assessment with the patient and the patient consents to the risk of discharge as well as the risk of uncertainty in estimating outcomes.    The patient's HEART Score is <4. In rare cases, I give patients with HEART Score of 4 the option of discharge, but only when they meet criteria for "Low 4," meaning that HST was used, and the 4 is not from a highly suspicious story, highly suspicious EKG, or positive cardiac enzymes. In these selected cases, the risk of a "Low 4" is still most likely lower than the risk of admission and further testing/imaging. [id number]    DDX: ACS, Anemia, Arhythmia, Asthma, CHF, Metabolic Acidosis, Pneumonia, Pulmonary Edema, URI    DISCUSSION:  This  appears to be a moderate condition.  This appears to be an acute condition.    21 y.o. female G1 P0 at 32-0/7 with 1 day of shortness of breath.  In evaluation of the above differential diagnosis, consideration was given to the following tests and treatments.  Patient had 2 negative troponins and 2 normal EKGs.  Normal chest x-ray, normal CBC and CMP.  Very unlikely ACS given his work-up.  Patient will need to be seen up in L&D for NST. The decision to perform testing and results were discussed with the patient.  I discussed each of these tests and considerations with the patient. They agree with the plan of discharge.     ADDITIONAL CONSIDERATIONS:  None    Critical Care Time:     None    Jeanella Craze, MD    Diagnosis and Disposition     DISPOSITION      DISCHARGE NOTE:  Jaclyn Jimenez's  results have been reviewed with her.  She has been counseled regarding her diagnosis, treatment, and plan.  She verbally conveys understanding and agreement of the signs, symptoms, diagnosis, treatment and prognosis and additionally agrees to follow up as discussed.  She  also agrees with the care-plan and conveys that all of her questions have been answered.  I have also provided discharge instructions for her that include: educational information regarding their diagnosis and treatment, and list of reasons why they would want to return to the ED prior to their follow-up appointment, should her condition change. She has been provided with education for proper emergency department utilization.     CLINICAL IMPRESSION:  1. Shortness of breath    2. [redacted] weeks gestation of pregnancy        PLAN:  D/C Home    DISCHARGE MEDICATIONS:  Current Discharge Medication List             Details   Prenatal Vit-Fe Fumarate-FA (PRENATAL PO) Take by mouth      !! ondansetron (ZOFRAN-ODT) 4 MG disintegrating tablet Take 1 tablet by mouth 3 times daily as needed for Nausea or Vomiting  Qty: 21 tablet, Refills: 0      !! ondansetron  (ZOFRAN-ODT) 4 MG disintegrating tablet Take 4 mg by mouth every 8 hours as needed       !! - Potential duplicate medications found. Please discuss with provider.          DISCONTINUED MEDICATIONS:  Current Discharge Medication List          PATIENT REFERRED TO:  Follow Up with:  Lendell Caprice, MD  [address]  [address]  [place] [address]  [phone]    Schedule an appointment as soon as possible for a visit   As soon as possible, For follow up from the Emergency Department    [facility]  [place]  [phone]    As needed, If symptoms worsen      I Jeanella Craze, MD am the primary clinician of record.    Dragon Disclaimer     Please note that this dictation was completed with Dragon, the Acupuncturist.  Quite often unanticipated grammatical, syntax, homophones, and other interpretive errors are inadvertently transcribed by the computer software.  Please disregard these errors.  Please excuse any errors that have escaped final proofreading.    Jeanella Craze, MD  (Electronically signed)           Sheran Fava, MD  08/04/22 [phone]

## 2022-08-04 NOTE — Progress Notes (Signed)
0725 Pt is a 21 y.o. G2P0010 at [redacted]w[redacted]d, arrived to L&D triage with c/o of SOB. Pt was seen in the ER and cleared.   EFM and TOCO applied, call bell within reach.     0272 Reactive NST reviewed with Dr. Marlaine Hind discharge orders received.     [id number] Discharge instructions reviewed. Pt verbalizes understanding.Pt ambulates off of unit.

## 2022-08-04 NOTE — Discharge Instructions (Signed)
Follow-up with your OB/GYN.  Return to the ED for worsening symptoms or for other concerns.

## 2022-08-05 LAB — EKG 12-LEAD
Atrial Rate: 69 {beats}/min
Atrial Rate: 69 {beats}/min
Diagnosis: NORMAL
Diagnosis: NORMAL
P Axis: 20 degrees
P Axis: 7 degrees
P-R Interval: 124 ms
P-R Interval: 146 ms
Q-T Interval: 394 ms
Q-T Interval: 404 ms
QRS Duration: 76 ms
QRS Duration: 76 ms
QTc Calculation (Bazett): 422 ms
QTc Calculation (Bazett): 432 ms
R Axis: -17 degrees
R Axis: -8 degrees
T Axis: 3 degrees
T Axis: 4 degrees
Ventricular Rate: 69 {beats}/min
Ventricular Rate: 69 {beats}/min

## 2023-04-19 ENCOUNTER — Inpatient Hospital Stay
Admit: 2023-04-19 | Discharge: 2023-04-19 | Disposition: A | Discharge status: Home / Self Care | Payer: TRICARE (CHAMPUS) | Attending: Emergency Medicine

## 2023-04-19 DIAGNOSIS — J029 Acute pharyngitis, unspecified: Secondary | ICD-10-CM

## 2023-04-19 MED ORDER — PREDNISONE 20 MG PO TABS
20 | ORAL_TABLET | Freq: Two times a day (BID) | ORAL | 0 refills | Status: AC
Start: 2023-04-19 — End: 2023-04-24

## 2023-04-19 MED ORDER — PENICILLIN V POTASSIUM 500 MG PO TABS
500 | ORAL_TABLET | Freq: Four times a day (QID) | ORAL | 0 refills | Status: AC
Start: 2023-04-19 — End: 2023-04-29

## 2023-04-19 MED ORDER — PENICILLIN V POTASSIUM 250 MG PO TABS
250 | ORAL | Status: AC
Start: 2023-04-19 — End: 2023-04-19
  Administered 2023-04-19: 13:00:00 500 mg via ORAL

## 2023-04-19 MED ORDER — PREDNISONE 20 MG PO TABS
20 | ORAL | Status: AC
Start: 2023-04-19 — End: 2023-04-19
  Administered 2023-04-19: 13:00:00 40 mg via ORAL

## 2023-04-19 MED FILL — PENICILLIN V POTASSIUM 250 MG PO TABS: 250 MG | ORAL | Qty: 2

## 2023-04-19 MED FILL — PREDNISONE 20 MG PO TABS: 20 MG | ORAL | Qty: 2

## 2023-04-19 NOTE — ED Provider Notes (Signed)
EMERGENCY DEPARTMENT HISTORY AND PHYSICAL EXAM    Date: 04/19/2023  Patient Name: Jaclyn Jimenez    History of Presenting Illness     Chief Complaint   Patient presents with    Pharyngitis    Otalgia     Pt stated that for the last two day she has had a sore throat and ear pain. Pt stated that its the left ear          History Provided By: Patient    Additional History (Context):   8:18 AM EDT  Dionne Milo Vivar Bradly Bienenstock is a 22 y.o. female with PMHX of anemia who presents to the emergency department C/O sore throat and ear pain.  Patient presents for 2 days worth of pain in the throat swallowing now radiating to the left ear.  She has had minimal sinus drainage.  She has no cough.  She has no fevers but subjective chills.  She denies knowledge of exposures to other sick or ill.  She is immunocompetent receives routine vaccinations in the [employer] and they are up-to-date.    Social History  No smoking drinking or drugs    Family History  No immunocompromise.    PCP: Unknown, Provider, APRN - NP    No current facility-administered medications for this encounter.     Current Outpatient Medications   Medication Sig Dispense Refill    penicillin v potassium (VEETID) 500 MG tablet Take 1 tablet by mouth 4 times daily for 10 days 40 tablet 0    predniSONE (DELTASONE) 20 MG tablet Take 1 tablet by mouth 2 times daily for 5 days 10 tablet 0    Prenatal Vit-Fe Fumarate-FA (PRENATAL PO) Take by mouth         Past History     Past Medical History:  Past Medical History:   Diagnosis Date    Anemia     Herpes simplex virus (HSV) infection        Past Surgical History:  Past Surgical History:   Procedure Laterality Date    WISDOM TOOTH EXTRACTION  2019       Family History:  History reviewed. No pertinent family history.    Social History:  Social History     Tobacco Use    Smoking status: Never    Smokeless tobacco: Never   Substance Use Topics    Alcohol use: Not Currently    Drug use: Not Currently        Allergies:  No Known Allergies      Physical Exam     Vitals:    04/19/23 0713   BP: 104/74   Pulse: 91   Resp: 19   Temp: 97.9 F (36.6 C)   SpO2: 98%   Weight: 49 kg (108 lb)   Height: 1.499 m (4\' 11" )     Physical Exam  Vitals and nursing note reviewed.   Constitutional:       Appearance: Normal appearance. She is not ill-appearing or toxic-appearing.   HENT:      Head: Normocephalic and atraumatic.      Right Ear: Tympanic membrane and external ear normal.      Left Ear: Tympanic membrane and external ear normal.      Ears:      Comments: No blood or fluid from ears or nose     Nose:      Comments: No blood or fluid from ears or nose     Mouth/Throat:  Mouth: Mucous membranes are moist.      Pharynx: Oropharyngeal exudate and posterior oropharyngeal erythema present.      Tonsils: No tonsillar abscesses. 1+ on the right. 1+ on the left.   Eyes:      Extraocular Movements: Extraocular movements intact.   Neck:      Trachea: Trachea and phonation normal.   Cardiovascular:      Rate and Rhythm: Normal rate and regular rhythm.      Pulses: Normal pulses.      Heart sounds: Normal heart sounds.   Pulmonary:      Effort: Pulmonary effort is normal.      Breath sounds: Normal breath sounds.   Abdominal:      Palpations: Abdomen is soft.      Tenderness: There is no abdominal tenderness.   Musculoskeletal:         General: No deformity.      Cervical back: No rigidity.   Skin:     General: Skin is warm and dry.      Capillary Refill: Capillary refill takes less than 2 seconds.   Neurological:      General: No focal deficit present.      Mental Status: She is alert and oriented to person, place, and time.   Psychiatric:         Mood and Affect: Mood normal.         Behavior: Behavior normal. Behavior is cooperative.       Diagnostic Study Results     Labs -  Strep analysis not not taken.  Will treat symptomatically by [facility] criteria.    Radiologic Studies -none taken  No orders to display      @CT48 @  @CXR48 @    Medications given in the ED-  Medications   predniSONE (DELTASONE) tablet 40 mg (40 mg Oral Given 04/19/23 0832)   penicillin v potassium (VEETID) tablet 500 mg (500 mg Oral Given 04/19/23 1610)         Medical Decision Making   I am the first provider for this patient.    I reviewed the vital signs, available nursing notes, past medical history, past surgical history, family history and social history.    Vital Signs-Reviewed the patient's vital signs.    Pulse Oximetry Analysis - 98% on room air    Records Reviewed: NURSING NOTES AND PREVIOUS MEDICAL RECORDS    Provider Notes (Medical Decision Making):   This acute condition of minor complexity severity.  Immunocompetent healthy 22 year old female with symptoms of sore throat painful swallowing going to her throat.  By exam she had no evidence of PTA.  She is immunocompetent unlikely to have epiglottitis laryngeal tracheitis or bacterial sources diphtheria etc.  She shows no secondary findings of meningitis pneumonia or dehydration.  Will treat symptomatically steroids antibiotics write her off work 24 hours follow-up with [employer] physician.    Social Determinants of Health     Tobacco Use: Low Risk  (04/19/2023)    Patient History     Smoking Tobacco Use: Never     Smokeless Tobacco Use: Never     Passive Exposure: Not on file   Alcohol Use: Not At Risk (04/19/2023)    AUDIT-C     Frequency of Alcohol Consumption: Never     Average Number of Drinks: Patient does not drink     Frequency of Binge Drinking: Never   Financial Resource Strain: Not on file   Food Insecurity: Not on file  Transportation Needs: Not on file   Physical Activity: Not on file   Stress: Not on file   Social Connections: Not on file   Intimate Partner Violence: Not on file   Depression: Not on file   Housing Stability: Not on file   Interpersonal Safety: Not At Risk (07/03/2022)    Interpersonal Safety Domain Source: IP Abuse Screening     Physical abuse: Denies     Verbal  abuse: Denies     Emotional abuse: Denies     Financial abuse: Denies     Sexual abuse: Denies   Utilities: Not on file         Procedures:  Procedures    ED Course:   8:18 AM EDT: Initial assessment performed. The patients presenting problems have been discussed, and they are in agreement with the care plan formulated and outlined with them.  I have encouraged them to ask questions as they arise throughout their visit.         Diagnosis and Disposition       DISCHARGE NOTE:  8:36 AM  Lithuania Vivar Martinez's  results have been reviewed with her.  She has been counseled regarding her diagnosis, treatment, and plan.  She verbally conveys understanding and agreement of the signs, symptoms, diagnosis, treatment and prognosis and additionally agrees to follow up as discussed.  She also agrees with the care-plan and conveys that all of her questions have been answered.  I have also provided discharge instructions for her that include: educational information regarding their diagnosis and treatment, and list of reasons why they would want to return to the ED prior to their follow-up appointment, should her condition change. She has been provided with education for proper emergency department utilization.     CLINICAL IMPRESSION:    1. Acute pharyngitis, unspecified etiology        PLAN:  1. D/C Home  2.      Medication List        START taking these medications      penicillin v potassium 500 MG tablet  Commonly known as: VEETID  Take 1 tablet by mouth 4 times daily for 10 days     predniSONE 20 MG tablet  Commonly known as: DELTASONE  Take 1 tablet by mouth 2 times daily for 5 days            ASK your doctor about these medications      PRENATAL PO               Where to Get Your Medications        These medications were sent to [facility] [address] [place] - [address] - P [phone] - F [phone]  [address] [place] [address]      Phone: [phone]   penicillin v  potassium 500 MG tablet  predniSONE 20 MG tablet       3. @DCFOLLOWUP @  _______________________________    This note was partially transcribed via voice recognition software. Although efforts have been made to catch any discrepancies, it may contain sound alike words, grammatical errors, or nonsensical words.             Nada Libman, MD  04/23/23 [phone]

## 2023-07-27 DIAGNOSIS — J069 Acute upper respiratory infection, unspecified: Secondary | ICD-10-CM

## 2023-07-27 NOTE — ED Triage Notes (Signed)
Pt c/o HA, chills, and loss of sense of smell and taste x 5 days. Pt states she just came back from [place] on 07/08/23. Pt is afebrile with no sob or cp reported in triage.

## 2023-07-27 NOTE — ED Provider Notes (Signed)
 [facility] EMERGENCY DEPT  EMERGENCY DEPARTMENT ENCOUNTER       Pt Name: Jaclyn Jimenez  MRN: [id number]  Birthdate 2000/12/12  Date of evaluation: 07/27/2023  PCP: Unknown, Provider  Note Started: 2:11 AM 07/28/23     CHIEF COMPLAINT       Chief Complaint   Patient presents with    Chills    Headache     Pt c/o HA, chills, and loss of sense of smell and taste x 5 days. Pt states she just came back from [place]  on 07/08/23. Pt is afebrile with no sob or cp reported in triage.         HISTORY OF PRESENT ILLNESS: 1 or more elements      History From: Patient  HPI Limitations: None  Chronic Conditions: Anemia, HSV  Social Determinants affecting Dx or Tx: None       Jaclyn Jimenez is a 22 y.o. female who presents to ED c/o flulike symptoms x 5 days.  Patient reports headache, chills, loss of taste and smell, mild pharyngitis, nonproductive cough and rhinorrhea.  No difficulty swallowing or managing secretions, no nausea or vomiting, no diarrhea, no rash, no otalgia, no chest pain or shortness of breath.  Patient is not take any medications prior to arrival today     Nursing Notes were all reviewed and agreed with or any disagreements were addressed in the HPI.    PAST HISTORY     Past Medical History:  Past Medical History:   Diagnosis Date    Anemia     Herpes simplex virus (HSV) infection        Past Surgical History:  Past Surgical History:   Procedure Laterality Date    WISDOM TOOTH EXTRACTION  2019       Family History:  No family history on file.    Social History:  Social History     Socioeconomic History    Marital status: Single   Tobacco Use    Smoking status: Never    Smokeless tobacco: Never   Substance and Sexual Activity    Alcohol use: Not Currently    Drug use: Not Currently    Sexual activity: Yes       Allergies:  No Known Allergies    CURRENT MEDICATIONS      No current facility-administered medications for this encounter.     Current Outpatient Medications   Medication  Sig Dispense Refill    Prenatal Vit-Fe Fumarate-FA (PRENATAL PO) Take by mouth            PHYSICAL EXAM      Vitals:    07/27/23 2339   BP: 106/61   Pulse: 97   Resp: 20   Temp: 98.6 F (37 C)   TempSrc: Oral   SpO2: 98%   Weight: 49 kg (108 lb)   Height: 1.499 m (4' 11)     Physical Exam  Vitals and nursing note reviewed.   Constitutional:       General: She is not in acute distress.     Appearance: Normal appearance. She is not toxic-appearing.   HENT:      Head: Normocephalic and atraumatic.      Right Ear: External ear normal.      Left Ear: External ear normal.      Nose: Congestion and rhinorrhea present. Rhinorrhea is clear.      Right Sinus: No maxillary sinus tenderness or frontal sinus tenderness.  Left Sinus: No maxillary sinus tenderness or frontal sinus tenderness.      Mouth/Throat:      Mouth: Mucous membranes are moist.      Pharynx: Oropharynx is clear.      Tonsils: No tonsillar exudate or tonsillar abscesses. 1+ on the right. 1+ on the left.   Cardiovascular:      Rate and Rhythm: Normal rate and regular rhythm.      Pulses: Normal pulses.      Heart sounds: Normal heart sounds.   Pulmonary:      Effort: Pulmonary effort is normal.      Breath sounds: Normal breath sounds.   Musculoskeletal:         General: Normal range of motion.      Cervical back: Normal range of motion and neck supple.   Lymphadenopathy:      Cervical: No cervical adenopathy.   Skin:     General: Skin is warm and dry.      Capillary Refill: Capillary refill takes less than 2 seconds.   Neurological:      General: No focal deficit present.      Mental Status: She is alert and oriented to person, place, and time.   Psychiatric:         Mood and Affect: Mood normal.         Behavior: Behavior normal.              DIAGNOSTIC RESULTS   LABS:    Recent Results (from the past 24 hour(s))   COVID-19 & Influenza Combo    Collection Time: 07/27/23 11:42 PM    Specimen: Nasopharyngeal   Result Value Ref Range    Source  Nasopharyngeal      SARS-CoV-2, PCR Not detected NOTD      Rapid Influenza A By PCR Not detected NOTD      Rapid Influenza B By PCR Not detected NOTD         Labs Reviewed   COVID-19 & INFLUENZA COMBO                   PROCEDURES   Unless otherwise noted below, none  Procedures         CRITICAL CARE TIME   None     EMERGENCY DEPARTMENT COURSE and DIFFERENTIAL DIAGNOSIS/MDM   Vitals:    Vitals:    07/27/23 2339   BP: 106/61   Pulse: 97   Resp: 20   Temp: 98.6 F (37 C)   TempSrc: Oral   SpO2: 98%   Weight: 49 kg (108 lb)   Height: 1.499 m (4' 11)       Patient was given the following medications:  Medications   acetaminophen  (TYLENOL ) tablet 1,000 mg (1,000 mg Oral Given 07/28/23 0055)   dexAMETHasone  (DECADRON ) tablet 8 mg (8 mg Oral Given 07/28/23 0055)           Records Reviewed (source and summary): Nursing notes.    CLINICAL MANAGEMENT TOOLS:        ED COURSE       Medial Decision Making:  DDX: Flu, COVID, Other viral URI, Seasonal allergies, Strep, Pneumonia, and Sinusitis     Alert, non toxic appearing 22 y.o. female  who presents to ED c/o flulike symptoms x 5 days.  Patient reports headache, chills, loss of taste and smell, mild pharyngitis, nonproductive cough and rhinorrhea.  In evaluation of the above differential diagnosis, consideration was given to the following  tests and treatments: Patient is in no acute distress, vital signs are stable.  Lungs are clear to auscultation, do not suspect pneumonia.  No sinus tenderness, no indication of deep space infection on history or physical exam today.  Physical exam is not suggestive of strep.  COVID and flu are negative.  H&P is most consistent with viral URI at this time.  Patient was given p.o. Tylenol  and single dose of Decadron  in this department for symptom relief.  I discussed each of these tests and considerations with the patient. They agree with the plan of discharge home with supportive care.  Return precautions given.      FINAL IMPRESSION     1.  Viral URI with cough            DISPOSITION/PLAN   DISPOSITION Decision To Discharge 07/28/2023 12:58:15 AM  Condition at Disposition: Data Unavailable           PATIENT REFERRED TO:  No follow-up provider specified.       DISCHARGE MEDICATIONS:     Medication List        ASK your doctor about these medications      PRENATAL PO                     I am the Primary Clinician of Record.       (Please note that parts of this dictation were completed with voice recognition software. Quite often unanticipated grammatical, syntax, homophones, and other interpretive errors are inadvertently transcribed by the computer software. Please disregards these errors. Please excuse any errors that have escaped final proofreading.)      Lavonne Cass, Annabella SAUNDERS, APRN - NP  07/28/23 [phone]

## 2023-07-28 ENCOUNTER — Inpatient Hospital Stay
Admit: 2023-07-28 | Discharge: 2023-07-28 | Disposition: A | Discharge status: Home / Self Care | Payer: TRICARE (CHAMPUS)

## 2023-07-28 LAB — COVID-19 & INFLUENZA COMBO
Rapid Influenza A By PCR: NOT DETECTED
Rapid Influenza B By PCR: NOT DETECTED
SARS-CoV-2, PCR: NOT DETECTED

## 2023-07-28 MED ORDER — ACETAMINOPHEN 500 MG PO TABS
500 | ORAL | Status: AC
Start: 2023-07-28 — End: 2023-07-28

## 2023-07-28 MED ORDER — DEXAMETHASONE 4 MG PO TABS
4 | Freq: Once | ORAL | Status: AC
Start: 2023-07-28 — End: 2023-07-28

## 2023-07-28 MED ADMIN — acetaminophen (TYLENOL) tablet 1,000 mg: 1000 mg | ORAL | @ 05:00:00 | NDC 00904673061

## 2023-07-28 MED ADMIN — dexAMETHasone (DECADRON) tablet 8 mg: 8 mg | ORAL | @ 05:00:00 | NDC 00904726661

## 2023-07-28 MED FILL — DEXAMETHASONE 4 MG PO TABS: 4 MG | ORAL | Qty: 2

## 2023-07-28 MED FILL — ACETAMINOPHEN 500 MG PO TABS: 500 MG | ORAL | Qty: 2

## 2023-07-28 NOTE — Discharge Instructions (Signed)
 Increase fluid intake and rest  May use over-the-counter cough and cold medications according to package directions, include Mucinex for productive cough  Do not take additional tylenol if included in cough and cold medication  May use Cepacol lozenges according to package directions for sore throat  May use salt water gargles, warm tea with honey  May use over-the-counter sinus rinses for sinus congestion  Return to care for new or worsening symptoms to include shortness of breath, inability to swallow secretions or other concerning symptoms

## 2024-03-23 ENCOUNTER — Inpatient Hospital Stay
Admit: 2024-03-23 | Discharge: 2024-03-24 | Disposition: A | Discharge status: Home / Self Care | Payer: TRICARE (CHAMPUS)

## 2024-03-23 DIAGNOSIS — Z5329 Procedure and treatment not carried out because of patient's decision for other reasons: Secondary | ICD-10-CM

## 2024-06-27 ENCOUNTER — Inpatient Hospital Stay
Admit: 2024-06-27 | Discharge: 2024-06-27 | Disposition: A | Discharge status: Home / Self Care | Payer: TRICARE (CHAMPUS) | Arrived: VH | Attending: Emergency Medicine

## 2024-06-27 DIAGNOSIS — N3000 Acute cystitis without hematuria: Principal | ICD-10-CM

## 2024-06-27 LAB — COVID-19 & INFLUENZA COMBO
Rapid Influenza A By PCR: NOT DETECTED
Rapid Influenza B By PCR: NOT DETECTED
SARS-CoV-2, PCR: NOT DETECTED

## 2024-06-27 LAB — URINALYSIS
Bilirubin, Urine: NEGATIVE
Blood, Urine: NEGATIVE
Glucose, Ur: NEGATIVE mg/dL
Ketones, Urine: NEGATIVE mg/dL
Leukocyte Esterase, Urine: NEGATIVE
Nitrite, Urine: POSITIVE — AB
Protein, UA: NEGATIVE mg/dL
Specific Gravity, UA: 1.018 (ref 1.005–1.030)
Urobilinogen, Urine: 1 EU/dL (ref 0.2–1.0)
pH, Urine: 6.5 (ref 5.0–8.0)

## 2024-06-27 LAB — WET PREP, GENITAL
Wet Prep: NONE SEEN
Wet Prep: NONE SEEN

## 2024-06-27 LAB — PREGNANCY, URINE: Pregnancy, Urine: NEGATIVE

## 2024-06-27 LAB — URINALYSIS, MICRO: RBC, UA: NEGATIVE /HPF (ref 0–5)

## 2024-06-27 MED ORDER — ONDANSETRON 4 MG PO TBDP
4 | ORAL_TABLET | Freq: Three times a day (TID) | ORAL | 0 refills | 7.00000 days | Status: AC | PRN
Start: 2024-06-27 — End: ?

## 2024-06-27 MED ORDER — METOCLOPRAMIDE HCL 10 MG PO TABS
10 | ORAL_TABLET | Freq: Three times a day (TID) | ORAL | 0 refills | 10.00000 days | Status: AC | PRN
Start: 2024-06-27 — End: ?

## 2024-06-27 MED ORDER — CIPROFLOXACIN HCL 500 MG PO TABS
500 | ORAL_TABLET | Freq: Two times a day (BID) | ORAL | 0 refills | 7.00000 days | Status: AC
Start: 2024-06-27 — End: 2024-07-04

## 2024-06-27 MED ORDER — PHENAZOPYRIDINE HCL 100 MG PO TABS
100 | ORAL_TABLET | Freq: Three times a day (TID) | ORAL | 0 refills | 3.00000 days | Status: AC | PRN
Start: 2024-06-27 — End: 2024-06-30

## 2024-06-27 MED ORDER — METRONIDAZOLE 500 MG PO TABS
500 | ORAL_TABLET | Freq: Two times a day (BID) | ORAL | 0 refills | 7.00000 days | Status: AC
Start: 2024-06-27 — End: 2024-07-04

## 2024-06-27 MED ORDER — ONDANSETRON 4 MG PO TBDP
4 | Freq: Once | ORAL | Status: AC
Start: 2024-06-27 — End: 2024-06-27
  Administered 2024-06-27: 09:00:00 4 mg via ORAL

## 2024-06-27 MED ORDER — METOCLOPRAMIDE HCL 10 MG PO TABS
10 | Freq: Once | ORAL | Status: AC
Start: 2024-06-27 — End: 2024-06-27
  Administered 2024-06-27: 11:00:00 10 mg via ORAL

## 2024-06-27 MED FILL — ONDANSETRON 4 MG PO TBDP: 4 mg | ORAL | Qty: 1 | Fill #0

## 2024-06-27 MED FILL — METOCLOPRAMIDE HCL 10 MG PO TABS: 10 mg | ORAL | Qty: 1 | Fill #0

## 2024-06-27 NOTE — ED Provider Notes (Signed)
 EMERGENCY DEPARTMENT HISTORY AND PHYSICAL EXAM      Date: 06/27/2024  Patient Name: Jaclyn Jimenez      History of Presenting Illness     Chief Complaint   Patient presents with    Chills    Nausea       Location/Duration/Severity/Modifying factors   Chief Complaint   Patient presents with    Chills    Nausea       HPI:  Jaclyn Jimenez is a 23 y.o. female with PMH significant for anemia, HSV infection presents with acute onset of chills, nausea without vomiting started last night around 6 PM.  Has also had foul-smelling urine intermittently over the past month as well as intermittent bilateral pelvic pain. Pt  denies fevers, cough or congestion.  Has had intermittent pain in the left side of her neck radiating to her left ear but not pain in the throat with swallowing. Treatments attempted at home include none.  Denies any known sick contacts.  Denies associated diarrhea or constipation.  Patient denies noticing any vaginal discharge, no concern for STI with recent new sexual partners, multiple sexual partners.    PCP: Unknown, Provider    No current facility-administered medications for this encounter.     Current Outpatient Medications   Medication Sig Dispense Refill    metoclopramide  (REGLAN ) 10 MG tablet Take 1 tablet by mouth 3 times daily as needed (nausea, vomiting) 30 tablet 0    ondansetron  (ZOFRAN -ODT) 4 MG disintegrating tablet Take 1 tablet by mouth every 8 hours as needed for Nausea or Vomiting (second line for vomiting) 30 tablet 0    ciprofloxacin  (CIPRO ) 500 MG tablet Take 1 tablet by mouth 2 times daily for 7 days 14 tablet 0    metroNIDAZOLE  (FLAGYL ) 500 MG tablet Take 1 tablet by mouth 2 times daily for 7 days 14 tablet 0    phenazopyridine  (PYRIDIUM ) 100 MG tablet Take 1 tablet by mouth 3 times daily as needed for Pain 9 tablet 0    Prenatal Vit-Fe Fumarate-FA (PRENATAL PO) Take by mouth         Past History     Past Medical History:  Past Medical History:    Diagnosis Date    Anemia     Herpes simplex virus (HSV) infection        Past Surgical History:  Past Surgical History:   Procedure Laterality Date    WISDOM TOOTH EXTRACTION  2019       Family History:  No family history on file.    Social History:  Social History     Tobacco Use    Smoking status: Never    Smokeless tobacco: Never   Substance Use Topics    Alcohol use: Not Currently    Drug use: Not Currently       Allergies:  No Known Allergies      Physical Exam     General: Patient is awake and alert, resting comfortably in no acute distress.  Cardiovascular: RRR, no murmurs.  Respiratory: Normal respiratory effort. Lung sounds clear to auscultation.  GI: soft, mildly tender palpation in the bilateral axilla region, no tenderness over the hypogastric region, non-distended.  GU: With female chaperone present, external genital examination shows no lesions, vesicles, pustules, chancres or visible discharge.  With sterile speculum examination, there was a moderate amount of thick yellowish-white discharge with the posterior vault and over the cervix as well as at the cervical os.  No  erythema, bleeding of the cervix with swabs performed.  No cervical motion tenderness.  Skin: No visible rashes, warm and dry.  Neuro: The patient is alert and oriented.  Psych: Appropriate mood and affect.        Lab and Diagnostic Study Results     Labs -  Recent Results (from the past 24 hours)   Urinalysis    Collection Time: 06/27/24  5:14 AM   Result Value Ref Range    Color, UA YELLOW      Appearance CLEAR      Specific Gravity, UA 1.018 1.005 - 1.030      pH, Urine 6.5 5.0 - 8.0      Protein, UA Negative NEG mg/dL    Glucose, Ur Negative NEG mg/dL    Ketones, Urine Negative NEG mg/dL    Bilirubin, Urine Negative NEG      Blood, Urine Negative NEG      Urobilinogen, Urine 1.0 0.2 - 1.0 EU/dL    Nitrite, Urine Positive (A) NEG      Leukocyte Esterase, Urine Negative NEG     Urine Preg (Lab)    Collection Time: 06/27/24  5:14 AM    Result Value Ref Range    Pregnancy, Urine Negative NEG     Urinalysis, Micro    Collection Time: 06/27/24  5:14 AM   Result Value Ref Range    WBC, UA 0-3 0 - 5 /hpf    RBC, UA Negative 0 - 5 /hpf    Epithelial Cells, UA FEW 0 - 5 /lpf    BACTERIA, URINE 4+ (A) NEG /hpf   COVID-19 & Influenza Combo    Collection Time: 06/27/24  5:24 AM    Specimen: Nasopharyngeal   Result Value Ref Range    Source Nasopharyngeal      SARS-CoV-2, PCR Not detected NOTD      Rapid Influenza A By PCR Not detected NOTD      Rapid Influenza B By PCR Not detected NOTD     Wet prep, genital    Collection Time: 06/27/24  6:15 AM    Specimen: Vaginal   Result Value Ref Range    Special Requests NO SPECIAL REQUESTS      Wet Prep CLUE CELLS PRESENT  FEW        Wet Prep NO YEAST SEEN      Wet Prep NO TRICHOMONAS SEEN         Radiologic Studies -   No orders to display         Procedures and Critical Care     Performed by: SULA GINS, DO    Procedures     SULA GINS, DO    Medical Decision Making and ED Course   - I am the first and primary provider for this patient AND AM THE PRIMARY PROVIDER OF RECORD.    - I reviewed the vital signs, available nursing notes, past medical history, past surgical history, family history and social history.    - Initial assessment performed. The patients presenting problems have been discussed, and the staff are in agreement with the care plan formulated and outlined with them.  I have encouraged them to ask questions as they arise throughout their visit.    Vital Signs-Reviewed the patient's vital signs.    Patient Vitals for the past 12 hrs:   Temp Pulse Resp BP SpO2   06/27/24 0446 98.5 F (36.9 C) 83 18 108/71 99 %  Provider Notes (Medical Decision Making):        Clinical presentation most consistent with possible complicated UTI considering chills, nausea, urinalysis finding.  Plan to treat with 7-day course of ciprofloxacin .  Wet prep was added which showed few clue cells. Considering  vaginal discharge and pelvic discomfort, will treat for Bacterial Vaginosis.  GC CT PCR pending at time of disposition below clinical suspicion for pelvic inflammatory disease or cervicitis secondary to STI warranting empiric treatment in the ED.  COVID influenza swabs returned negative.  Urine pregnancy test returned negative.  Does not meet SIRS criteria based on vital signs this did not add screening labs.  No complaint of flank plain, no CVA tenderness to warrant CT imaging to assess for obstructive ureteral stone. Pt feeling improved on reassessment. Will rx Reglan  and Zofran  for nausea and Pyridium  for urinary discomfort.     Documentation/Prior Results Review:  Nursing notes    Social Determinants of Health: None identified    Is this patient to be included in the SEP-1 core measure due to severe sepsis or septic shock? No Exclusion criteria - the patient is NOT to be included for SEP-1 Core Measure due to: 2+ SIRS criteria are not met    The patient will be discharged home. The patient was reassured that these symptoms do not appear to represent a serious or life threatening condition at this time. Warning signs of worsening condition were discusses and understood by the patient. Based on patient's age, coexisting illness, exam and the results of this ED evaluation, the decision to treat as an outpatient was made. Based on the information available at time of discharge, acute pathology requiring immediate intervention was deemed relative unlikely. While it is impossible to completely exclude the possibility of underlying serious disease or worsening condition, I feel the relative likelihood is extremely low. I discussed this uncertainty with the patient and/or family member/caregiver, who understood ED evaluation and treatment and felt comfortable with the outpatient treatment plan. All questions regarding care, test results and follow up were answered. At discharge, pt looked well, nontoxic, no distress,  and is good candidate for outpatient follow up. They understand that they should return to the Emergency Department for any new or worsening symptoms. I stressed the importance of follow up for repeat assessment and possibly further evaluation/treatment.        Meds Given in ED:  Medications   ondansetron  (ZOFRAN -ODT) disintegrating tablet 4 mg (4 mg Oral Given 06/27/24 0523)   metoclopramide  (REGLAN ) tablet 10 mg (10 mg Oral Given 06/27/24 0630)       Final Diagnosis:  1. Acute cystitis without hematuria    2. Nausea    3. Bacterial vaginosis        Disposition:  Destination: Discharge    Discharge Rx:   Discharge Medication List as of 06/27/2024  7:16 AM        START taking these medications    Details   metoclopramide  (REGLAN ) 10 MG tablet Take 1 tablet by mouth 3 times daily as needed (nausea, vomiting), Disp-30 tablet, R-0Normal      ondansetron  (ZOFRAN -ODT) 4 MG disintegrating tablet Take 1 tablet by mouth every 8 hours as needed for Nausea or Vomiting (second line for vomiting), Disp-30 tablet, R-0Normal      ciprofloxacin  (CIPRO ) 500 MG tablet Take 1 tablet by mouth 2 times daily for 7 days, Disp-14 tablet, R-0Normal      metroNIDAZOLE  (FLAGYL ) 500 MG tablet Take 1 tablet by mouth 2  times daily for 7 days, Disp-14 tablet, R-0Normal      phenazopyridine  (PYRIDIUM ) 100 MG tablet Take 1 tablet by mouth 3 times daily as needed for Pain, Disp-9 tablet, R-0Normal               Dictation disclaimer: Please note that this dictation was completed with Dragon, the computer voice recognition software. Quite often unanticipated grammatical, syntax, homophones, and other interpretive errors are inadvertently transcribed by the computer software. Please disregard these errors. Please excuse any errors that have escaped final proofreading.     Sula Gins, D.O.  Emergency Physician  [facility]  [facility]             Gins [place], [place]  06/27/24 1159

## 2024-06-27 NOTE — Discharge Instructions (Addendum)
 We will contact you if your STD testing returns positive. The result should be on your online portal you can set up an account with. Refrain from intercourse until tests are negative and you completed your antibiotics. Take the Pyridium  for urinary pain as needed. Ibuprofen 600 mg for fever, chills, body aches. I prescribed 2 nausea medications for you.

## 2024-06-27 NOTE — ED Notes (Signed)
 Also endorses headache and malodorous urine.

## 2024-06-27 NOTE — ED Triage Notes (Signed)
 Pt states has chills, nausea w/o vomiting since yesterday. Endorses diarrhea x 1 yesterday. Endorses headache, but denies sore throat or cough. No known sick contacts.

## 2024-06-27 NOTE — ED Notes (Signed)
Report given to Clara, RN

## 2024-06-27 NOTE — ED Notes (Signed)
 Lab called to ED for add on for urine culture. This RN checked patients chart, and unable to add on as this patient was seen back on 06/27/2024.

## 2024-06-28 LAB — CHLAMYDIA, GONORRHEA, TRICHOMONIASIS
Chlamydia trachomatis, NAA: NEGATIVE
Neisseria Gonorrhoeae, NAA: NEGATIVE
Trichomonas Vaginalis by NAA: NEGATIVE
